# Patient Record
Sex: Female | Born: 1985 | Race: White | Hispanic: No | Marital: Single | Smoking: Former smoker
Health system: Southern US, Community
[De-identification: ages and names within clinical notes are randomized; demographics above are authoritative.]

## PROBLEM LIST (undated history)

## (undated) ENCOUNTER — Inpatient Hospital Stay (HOSPITAL_COMMUNITY): Payer: Self-pay

## (undated) DIAGNOSIS — Z8619 Personal history of other infectious and parasitic diseases: Secondary | ICD-10-CM

## (undated) DIAGNOSIS — IMO0002 Reserved for concepts with insufficient information to code with codable children: Secondary | ICD-10-CM

## (undated) DIAGNOSIS — F329 Major depressive disorder, single episode, unspecified: Secondary | ICD-10-CM

## (undated) DIAGNOSIS — Z8659 Personal history of other mental and behavioral disorders: Secondary | ICD-10-CM

## (undated) DIAGNOSIS — F419 Anxiety disorder, unspecified: Secondary | ICD-10-CM

## (undated) DIAGNOSIS — Z8759 Personal history of other complications of pregnancy, childbirth and the puerperium: Secondary | ICD-10-CM

## (undated) DIAGNOSIS — M419 Scoliosis, unspecified: Secondary | ICD-10-CM

## (undated) DIAGNOSIS — D649 Anemia, unspecified: Secondary | ICD-10-CM

## (undated) DIAGNOSIS — R51 Headache: Secondary | ICD-10-CM

## (undated) DIAGNOSIS — G8929 Other chronic pain: Secondary | ICD-10-CM

## (undated) DIAGNOSIS — O139 Gestational [pregnancy-induced] hypertension without significant proteinuria, unspecified trimester: Secondary | ICD-10-CM

## (undated) DIAGNOSIS — R87619 Unspecified abnormal cytological findings in specimens from cervix uteri: Secondary | ICD-10-CM

## (undated) DIAGNOSIS — M79671 Pain in right foot: Secondary | ICD-10-CM

## (undated) DIAGNOSIS — F32A Depression, unspecified: Secondary | ICD-10-CM

## (undated) HISTORY — DX: Personal history of other infectious and parasitic diseases: Z86.19

## (undated) HISTORY — PX: TONSILLECTOMY AND ADENOIDECTOMY: SHX28

## (undated) HISTORY — DX: Anemia, unspecified: D64.9

## (undated) HISTORY — DX: Pain in right foot: M79.671

## (undated) HISTORY — PX: ENDOMETRIAL ABLATION: SHX621

## (undated) HISTORY — DX: Unspecified abnormal cytological findings in specimens from cervix uteri: R87.619

## (undated) HISTORY — DX: Major depressive disorder, single episode, unspecified: F32.9

## (undated) HISTORY — DX: Headache: R51

## (undated) HISTORY — DX: Personal history of other mental and behavioral disorders: Z86.59

## (undated) HISTORY — DX: Other chronic pain: G89.29

## (undated) HISTORY — PX: TONSILLECTOMY: SUR1361

## (undated) HISTORY — DX: Depression, unspecified: F32.A

## (undated) HISTORY — DX: Personal history of other complications of pregnancy, childbirth and the puerperium: Z87.59

## (undated) HISTORY — PX: WISDOM TOOTH EXTRACTION: SHX21

## (undated) HISTORY — DX: Reserved for concepts with insufficient information to code with codable children: IMO0002

## (undated) HISTORY — PX: COLPOSCOPY: SHX161

---
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
## 2004-01-10 ENCOUNTER — Ambulatory Visit (HOSPITAL_COMMUNITY): Admission: RE | Admit: 2004-01-10 | Discharge: 2004-01-10 | Payer: Self-pay | Admitting: Obstetrics and Gynecology

## 2004-02-08 ENCOUNTER — Ambulatory Visit (HOSPITAL_COMMUNITY): Admission: RE | Admit: 2004-02-08 | Discharge: 2004-02-08 | Payer: Self-pay | Admitting: Obstetrics and Gynecology

## 2004-03-21 ENCOUNTER — Ambulatory Visit (HOSPITAL_COMMUNITY): Admission: RE | Admit: 2004-03-21 | Discharge: 2004-03-21 | Payer: Self-pay | Admitting: Obstetrics and Gynecology

## 2004-04-10 ENCOUNTER — Inpatient Hospital Stay (HOSPITAL_COMMUNITY): Admission: AD | Admit: 2004-04-10 | Discharge: 2004-04-10 | Payer: Self-pay | Admitting: Obstetrics and Gynecology

## 2004-04-11 ENCOUNTER — Inpatient Hospital Stay (HOSPITAL_COMMUNITY): Admission: AD | Admit: 2004-04-11 | Discharge: 2004-04-11 | Payer: Self-pay | Admitting: Obstetrics and Gynecology

## 2004-04-21 ENCOUNTER — Inpatient Hospital Stay (HOSPITAL_COMMUNITY): Admission: AD | Admit: 2004-04-21 | Discharge: 2004-04-21 | Payer: Self-pay | Admitting: Obstetrics and Gynecology

## 2004-04-28 ENCOUNTER — Inpatient Hospital Stay (HOSPITAL_COMMUNITY): Admission: AD | Admit: 2004-04-28 | Discharge: 2004-04-30 | Payer: Self-pay | Admitting: Obstetrics and Gynecology

## 2004-05-06 ENCOUNTER — Inpatient Hospital Stay (HOSPITAL_COMMUNITY): Admission: AD | Admit: 2004-05-06 | Discharge: 2004-05-09 | Payer: Self-pay | Admitting: Obstetrics and Gynecology

## 2004-05-06 ENCOUNTER — Encounter (INDEPENDENT_AMBULATORY_CARE_PROVIDER_SITE_OTHER): Payer: Self-pay | Admitting: *Deleted

## 2005-04-07 ENCOUNTER — Other Ambulatory Visit: Admission: RE | Admit: 2005-04-07 | Discharge: 2005-04-07 | Payer: Self-pay | Admitting: Obstetrics and Gynecology

## 2006-07-13 ENCOUNTER — Other Ambulatory Visit: Admission: RE | Admit: 2006-07-13 | Discharge: 2006-07-13 | Payer: Self-pay | Admitting: Obstetrics and Gynecology

## 2007-02-15 ENCOUNTER — Encounter (INDEPENDENT_AMBULATORY_CARE_PROVIDER_SITE_OTHER): Payer: Self-pay | Admitting: Obstetrics and Gynecology

## 2007-02-15 ENCOUNTER — Ambulatory Visit (HOSPITAL_COMMUNITY): Admission: RE | Admit: 2007-02-15 | Discharge: 2007-02-15 | Payer: Self-pay | Admitting: Obstetrics and Gynecology

## 2007-02-17 ENCOUNTER — Encounter: Admission: RE | Admit: 2007-02-17 | Discharge: 2007-02-17 | Payer: Self-pay | Admitting: Obstetrics and Gynecology

## 2007-07-26 ENCOUNTER — Encounter (HOSPITAL_BASED_OUTPATIENT_CLINIC_OR_DEPARTMENT_OTHER): Payer: Self-pay | Admitting: General Surgery

## 2007-07-26 ENCOUNTER — Ambulatory Visit (HOSPITAL_COMMUNITY): Admission: RE | Admit: 2007-07-26 | Discharge: 2007-07-26 | Payer: Self-pay | Admitting: General Surgery

## 2010-09-21 ENCOUNTER — Encounter: Payer: Self-pay | Admitting: Obstetrics and Gynecology

## 2010-11-09 ENCOUNTER — Emergency Department (HOSPITAL_COMMUNITY): Payer: No Typology Code available for payment source

## 2010-11-09 ENCOUNTER — Emergency Department (HOSPITAL_COMMUNITY)
Admission: EM | Admit: 2010-11-09 | Discharge: 2010-11-09 | Disposition: A | Discharge status: Home / Self Care | Payer: No Typology Code available for payment source | Attending: Emergency Medicine | Admitting: Emergency Medicine

## 2010-11-09 DIAGNOSIS — R51 Headache: Secondary | ICD-10-CM | POA: Insufficient documentation

## 2010-11-09 DIAGNOSIS — S139XXA Sprain of joints and ligaments of unspecified parts of neck, initial encounter: Secondary | ICD-10-CM | POA: Insufficient documentation

## 2010-11-09 DIAGNOSIS — R10813 Right lower quadrant abdominal tenderness: Secondary | ICD-10-CM | POA: Insufficient documentation

## 2010-11-09 DIAGNOSIS — M25519 Pain in unspecified shoulder: Secondary | ICD-10-CM | POA: Insufficient documentation

## 2010-11-09 DIAGNOSIS — Z79899 Other long term (current) drug therapy: Secondary | ICD-10-CM | POA: Insufficient documentation

## 2010-11-09 DIAGNOSIS — M542 Cervicalgia: Secondary | ICD-10-CM | POA: Insufficient documentation

## 2010-11-09 DIAGNOSIS — M546 Pain in thoracic spine: Secondary | ICD-10-CM | POA: Insufficient documentation

## 2010-11-09 DIAGNOSIS — Y929 Unspecified place or not applicable: Secondary | ICD-10-CM | POA: Insufficient documentation

## 2010-11-09 DIAGNOSIS — IMO0001 Reserved for inherently not codable concepts without codable children: Secondary | ICD-10-CM | POA: Insufficient documentation

## 2010-11-09 LAB — POCT PREGNANCY, URINE: Preg Test, Ur: NEGATIVE

## 2010-11-09 LAB — URINALYSIS, ROUTINE W REFLEX MICROSCOPIC
Glucose, UA: NEGATIVE mg/dL
Hgb urine dipstick: NEGATIVE
Leukocytes, UA: NEGATIVE
Protein, ur: 30 mg/dL — AB
Specific Gravity, Urine: 1.025 (ref 1.005–1.030)
Urobilinogen, UA: 1 mg/dL (ref 0.0–1.0)
pH: 6 (ref 5.0–8.0)

## 2010-11-09 LAB — URINE MICROSCOPIC-ADD ON

## 2010-11-17 ENCOUNTER — Inpatient Hospital Stay (INDEPENDENT_AMBULATORY_CARE_PROVIDER_SITE_OTHER)
Admission: RE | Admit: 2010-11-17 | Discharge: 2010-11-17 | Disposition: A | Discharge status: Home / Self Care | Payer: No Typology Code available for payment source | Source: Ambulatory Visit | Attending: Family Medicine | Admitting: Family Medicine

## 2010-11-17 DIAGNOSIS — S335XXA Sprain of ligaments of lumbar spine, initial encounter: Secondary | ICD-10-CM

## 2010-11-17 DIAGNOSIS — S0510XA Contusion of eyeball and orbital tissues, unspecified eye, initial encounter: Secondary | ICD-10-CM

## 2010-11-17 DIAGNOSIS — F411 Generalized anxiety disorder: Secondary | ICD-10-CM

## 2010-11-17 DIAGNOSIS — G542 Cervical root disorders, not elsewhere classified: Secondary | ICD-10-CM

## 2010-11-17 DIAGNOSIS — S139XXA Sprain of joints and ligaments of unspecified parts of neck, initial encounter: Secondary | ICD-10-CM

## 2010-12-22 ENCOUNTER — Ambulatory Visit
Payer: Medicaid Other | Attending: Physical Medicine and Rehabilitation | Admitting: Rehabilitative and Restorative Service Providers"

## 2010-12-22 DIAGNOSIS — M545 Low back pain, unspecified: Secondary | ICD-10-CM | POA: Insufficient documentation

## 2010-12-22 DIAGNOSIS — M2569 Stiffness of other specified joint, not elsewhere classified: Secondary | ICD-10-CM | POA: Insufficient documentation

## 2010-12-22 DIAGNOSIS — IMO0001 Reserved for inherently not codable concepts without codable children: Secondary | ICD-10-CM | POA: Insufficient documentation

## 2010-12-22 DIAGNOSIS — M542 Cervicalgia: Secondary | ICD-10-CM | POA: Insufficient documentation

## 2010-12-26 ENCOUNTER — Ambulatory Visit: Payer: Medicaid Other | Admitting: Rehabilitative and Restorative Service Providers"

## 2010-12-30 ENCOUNTER — Ambulatory Visit: Payer: Medicaid Other | Admitting: Physical Therapy

## 2011-01-01 ENCOUNTER — Ambulatory Visit: Payer: Medicaid Other | Admitting: Physical Therapy

## 2011-01-05 ENCOUNTER — Ambulatory Visit
Payer: Medicaid Other | Attending: Physical Medicine and Rehabilitation | Admitting: Rehabilitative and Restorative Service Providers"

## 2011-01-05 DIAGNOSIS — M545 Low back pain, unspecified: Secondary | ICD-10-CM | POA: Insufficient documentation

## 2011-01-05 DIAGNOSIS — M2569 Stiffness of other specified joint, not elsewhere classified: Secondary | ICD-10-CM | POA: Insufficient documentation

## 2011-01-05 DIAGNOSIS — IMO0001 Reserved for inherently not codable concepts without codable children: Secondary | ICD-10-CM | POA: Insufficient documentation

## 2011-01-05 DIAGNOSIS — M542 Cervicalgia: Secondary | ICD-10-CM | POA: Insufficient documentation

## 2011-01-12 ENCOUNTER — Ambulatory Visit: Payer: Medicaid Other | Admitting: Rehabilitative and Restorative Service Providers"

## 2011-01-13 NOTE — Op Note (Signed)
[name], [name] NO.:  [id number]   MEDICAL RECORD NO.:  [id number]          PATIENT TYPE:  AMB   LOCATION:  DAY                          FACILITY:  [facility]   PHYSICIAN:  Leonie Man, M.D.   DATE OF BIRTH:  [birth year]   DATE OF PROCEDURE:  07/26/2007  DATE OF DISCHARGE:                               OPERATIVE REPORT   PREOPERATIVE DIAGNOSIS:  Endometrial implant, right groin.   POSTOPERATIVE DIAGNOSIS:  Endometrial implant, right groin.   PROCEDURE:  Excision endometrioma, right groin.   SURGEON:  Leonie Man, M.D.   ASSISTANT:  OR tech.   ANESTHESIA:  General.   SPECIMENS:  Right groin mass forwarded to pathology.   ESTIMATED BLOOD LOSS:  Minimal.   There no complications during this procedure.  The patient returned to  the PACU in excellent condition.   [name] is a 25 year old patient status post cesarean section who for  the past several months has been having pain in the right groin during  her menses.  The mass increased in size during menses and the pain is  increased at that time.  She comes to the operating room now after risks  and potential benefits of surgery have been discussed.  All questions  answered, consent surgery obtained.   PROCEDURE:  The patient is positioned supinely.  Following induction of  satisfactory anesthesia, the right groin is prepped and draped to be  included in a sterile operative field.  The right lateral border of her  old fascial incision is opened up through skin and subcutaneous tissue  and dissection carried down.  Flap was raised superiorly towards the  region of the mass.  The mass was then dissected free and excised.  This  mass went down to the rectus fascia and was excised including a portion  of the rectus fascia.  This was forwarded for pathologic evaluation. The  rectus fascia was then closed with interrupted 3-0 Vicryl sutures.  Subcutaneous tissues closed with 3-0 Vicryl sutures after  sponge, instrument, and sharp counts verified.  The skin was closed with  running 4-0 Monocryl suture and reinforced with Steri-Strips.  Sterile  dressings applied.  The anesthetic reversed.  The patient removed from  the operating room to the recovery room in stable condition.  She  tolerated the procedure well.      Leonie Man, M.D.  Electronically Signed     PB/MEDQ  D:  07/26/2007  T:  07/26/2007  Job:  [id number]   cc:   Osborn Coho, M.D.  Fax: [phone]

## 2011-01-13 NOTE — Op Note (Signed)
[name], [name] NO.:  [id number]   MEDICAL RECORD NO.:  [id number]          PATIENT TYPE:  AMB   LOCATION:  SDC                           [facility]   PHYSICIAN:  Osborn Coho, M.D.   DATE OF BIRTH:  October 09, 1985   DATE OF PROCEDURE:  02/15/2007  DATE OF DISCHARGE:                               OPERATIVE REPORT   PREOPERATIVE DIAGNOSIS:  1. Menometrorrhagia.  2. Polyp.   POSTOPERATIVE DIAGNOSIS:  1. Menometrorrhagia.  2. Dysfunctional uterine bleeding.   PROCEDURE:  1. Hysteroscopy.  2. Dilation and curettage.   ATTENDING:  Osborn Coho, M.D.   ANESTHESIA:  General via LMA.   FLUIDS:  1000 mL   URINE OUTPUT:  Quantity sufficient via straight cath prior to procedure.   ESTIMATED BLOOD LOSS:  Minimal.   HYSTEROSCOPIC FLUID DEFICIT OF SORBITOL:  20 mL   SPECIMENS TO PATHOLOGY:  Endometrial curettings.   COMPLICATIONS:  None.   PROCEDURE:  The patient was taken to the operating room after risks,  benefits, and alternatives reviewed with the patient.  The patient  verbalized understanding and consent signed and witnessed.  The patient  was placed under general anesthesia and prepped and draped in the normal  sterile fashion in the dorsal lithotomy position.  A bivalve speculum  placed in the patient's vagina and the anterior lip of the cervix  grasped with single-tooth tenaculum.  Paracervical block was  administered using a total of 10 mL of 1% lidocaine.  The cervix was  dilated for passage of the diagnostic hysteroscope.  Hysteroscope was  introduced after sounding the uterus to 10 cm and no intracavitary  lesions were noted.  Possibly a small polyp in the lower uterine  segment.  Curettage was performed until a gritty  texture was noted and curettings sent to pathology.  All instruments  were removed.  There was good hemostasis at the tenaculum site.  Count  was correct.  The patient tolerated procedure well and is currently  awaiting transfer to the recovery room in good condition.      Osborn Coho, M.D.  Electronically Signed     AR/MEDQ  D:  02/15/2007  T:  02/15/2007  Job:  [id number]

## 2011-01-13 NOTE — H&P (Signed)
NAME:  [name], [name] NO.:  [id number]   MEDICAL RECORD NO.:  [id number]          PATIENT TYPE:  AMB   LOCATION:  SDC                           [facility]   PHYSICIAN:  Osborn Coho, M.D.   DATE OF BIRTH:  1986-06-22   DATE OF ADMISSION:  DATE OF DISCHARGE:                              HISTORY & PHYSICAL   HISTORY OF PRESENT ILLNESS:  [name] is a 25 year old single white  female para 3, 0-0-3 who presents for hysteroscopy with possible  resection of an endometrial polyp due to menorrhagia.  For the past 6  months the patient has experienced almost daily vaginal bleeding.  She  may change her pad plus a tampon 1-4 times per day.  Additionally she  reports cramping which she rates as an 8/10 on a 10 point pain scale,  but does not take anything for this. Since the onset of her symptoms she  has been on Yaz oral contraceptives, but has not had any change in her  bleeding pattern.  She denies any nausea, vomiting, diarrhea, fever,  urinary tract symptoms or vaginitis symptoms.  She does, however, admit  to vulvar irritation due to friction from her perineal pads.  A sono  histogram in January of 2008 revealed a polyp in the lower uterine  segment of the endometrium measuring 0.7 cm x 0.2 cm.  In light of these  findings and her symptoms the patient wishes to proceed with polyp  resection via hysteroscopy.   OB HISTORY:  Gravida 2, para 3, 0-0-3.  The patient delivered a  singleton via spontaneous vaginal birth and twins by cesarean section.   GYN HISTORY:  Menarche 25 years old.  Last menstrual period ?  02/07/2007.  The patient uses Yaz as her method of contraception.  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  The  patient's last normal Pap smear was February 09, 2007 and is pending.   PAST MEDICAL HISTORY:  Migraines and anemia.   PAST SURGICAL HISTORY:  In 1993 tonsillectomy and adenoidectomy.   FAMILY HISTORY:   Cardiovascular disease, seizure, stroke, migraines,  depression, bipolar disorder, COPD, throat cancer, diabetes (type 2).   SOCIAL HISTORY:  The patient is single and she is unemployed.   HABITS:  She does not use alcohol.  She smokes one pack of cigarettes  per day.   CURRENT MEDICATIONS:  1. Femcon FE one tablet 3x a day (to be taken for 7 days starting      02/09/2007).  2. Mycolog II cream twice daily to vulvar area.   ALLERGIES:  PENICILLIN.  The patient is unsure of the reaction.   REVIEW OF SYSTEMS:  The patient admits to lightheadedness, decreased  appetite, but denies any chest pain, shortness of breath, headache,  vision changes, myalgias and except as is mentioned in history of  present illness the patient's review of systems is negative.   PHYSICAL EXAMINATION:  Blood pressure 112/78, weight is 174, height is 5  feet 6 inches tall.  NECK:  Supple.  There are no masses, thyromegaly or cervical adenopathy.  HEART:  Regular rate and rhythm.  LUNGS:  Clear.  There are no wheezes, rales or rhonchi.  BACK:  No CVA tenderness.  ABDOMEN:  No tenderness, masses or organomegaly.  EXTREMITIES:  No clubbing, cyanosis or edema.  PELVIC:  EG BUS.  The patient does have a linear, erythematous rash  along the border of her labia majora.  There is no inguinal adenopathy.  Vagina is normal with a small amount of brown discharge.  Cervix is  nontender without lesions.  Uterus appears normal size, shape and  consistency without tenderness.  Adnexa: No tenderness or masses.   Urine pregnancy test is negative.  Hemoglobin 14.8.   IMPRESSION:  1. Menorrhagia.  2. Endometrial polyp.   DISPOSITION:  A discussion was held with the patient regarding the  indication for her procedures along with their risks which include, but  are not limited to reaction to anesthesia, damage to adjacent organs,  excessive bleeding and infection.  The patient has consented to proceed  with hysteroscopy  with a possibility of polyp resection at [facility] on February 15, 2007 at 02:00 p.m.      Elmira J. Adline Peals.      Osborn Coho, M.D.  Electronically Signed    EJP/MEDQ  D:  02/10/2007  T:  02/10/2007  Job:  [id number]

## 2011-01-15 ENCOUNTER — Ambulatory Visit: Payer: Medicaid Other | Admitting: Rehabilitative and Restorative Service Providers"

## 2011-01-16 NOTE — H&P (Signed)
NAME:  [name], EDISON                          ACCOUNT NO.:  [id number]   MEDICAL RECORD NO.:  [id number]                   PATIENT TYPE:  INP   LOCATION:  9170                                 FACILITY:  [facility]   PHYSICIAN:  Janine Limbo, M.D.            DATE OF BIRTH:  26-Nov-1985   DATE OF ADMISSION:  04/28/2004  DATE OF DISCHARGE:                                HISTORY & PHYSICAL   HISTORY OF PRESENT ILLNESS:  Ms. Zimny is an 25 year old, gravida 2, para 1-  0-0-1, at 18 and 6/7ths weeks with a twin gestation, who presented from the  office for PIH evaluation.  She was seen today for a routine visit.  She did  complain of mild headache, black visual spots, and increased swelling.  She  reports she gained approximately 7 pounds since last visit approximately 1  week ago.  She does report some cramping sporadically.  The cervix has been  2 cm, but had no change today per Dr. Normand Sloop.  She is on terbutaline q.4h.  She received betamethasone on April 10, 2004 and April 11, 2004.  She had  a trace protein on urine examination today.  While in maternity admission,  she had a PIH work up which was negative.  Blood pressures were within  normal limits, but she was noted to be contracting significantly.  She took  her scheduled dose of p.o. terbutaline, which was of no benefit.  Then she  received a dose of subcutaneous terbutaline, which, again, was of no  benefit.  She is now therefore to be admitted for magnesium sulfate therapy.  Pregnancy has been remarkable for (1) twin gestation, with twin A in a  breech presentation, twin B transverse, (2) premature labor this pregnancy,  currently on bed rest, (3) positive group B strep.   PRENATAL LABORATORIES:  Blood type is A positive, Rh antibody negative, VDRL  nonreactive, rubella titer positive, hepatitis B surface antigen negative,  HIV nonreactive.  GC and Chlamydia cultures were negative at her first  visit.  Pap smear was normal.   Glucose challenge was normal.  HIV was  nonreactive.  Group B strep culture was positive approximately 2 weeks ago.  Her EDC of June 16, 2004 was established by a 10-week ultrasound.   HISTORY OF PRESENT PREGNANCY:  The patient entered care at approximately 11  weeks.  She was diagnosis with diamniotic dichorionic twins.  Ultrasound at  17 weeks showed size equal to dates, 2 female infants.  The cervix was  normal.  Quad screen was normal.  Another ultrasound was done at 21 weeks,  showing appropriate growth and normal cervix.  She had some spotting at 25  weeks, which was evaluated and found to have no issue.  She had her Glucola,  which was normal.  She had an ultrasound at 27 weeks showing twin A  transverse, twin B transverse, with normal cervical length.  She was having  some pressure at 29 weeks, but cervix was normal.  She began to have some  other preterm labor symptoms at 30 weeks.  Her cervix at that time was 1-2,  70%, vertex, and -1 station.  Fetal fibronectin was sent, and was negative.  She was admitted for 23-hour observation.  She received betamethasone.  There was no change in her cervix at 31 weeks.  She was diagnosed with  positive group B5 strep.  She was seen at the hospital last week for some  contractions, and was admitted overnight again.  She was seen in the office  today with her cervix 2 cm, and blood pressure 140/88, and 140/90.  Urine  was trace for protein.   OBSTETRICAL HISTORY:  In August of 2003, she had a vaginal birth of a female  infant, weight 7 pounds, 8 ounces, at 36 and 3/7ths weeks.  She was in labor  19 hours.  She had epidural anesthesia with no complications.   MEDICAL HISTORY:  She is an oral contraceptive user.  She has a history of  oral HSV-1.  She has an occasional yeast infection.  She reports the usual  childhood illnesses.   PAST SURGICAL HISTORY:  Her only surgery was tonsillectomy and adenoidectomy  in 1993.   ALLERGIES:  She is  possibly sensitive to PENICILLIN.  She was told this as a  child, but has had no other exposures to it.   FAMILY HISTORY:  Her maternal grandmother had heart disease.  Father of the  baby has hypertension.  Her mother and maternal grandmother had emphysema.  Maternal grandmother had a stroke.  Maternal cousin had a seizure.  Paternal  grandfather had throat cancer.  Mother, maternal aunt, and maternal  grandmother had migraines.  Her father is bipolar.  Her mother had a history  of depression.  The patient's mother died last year as a result of a car  accident.  There are multiple smokers in the family.   GENETIC HISTORY:  Unremarkable, except for twins on the father of the baby's  side.   SOCIAL HISTORY:  The patient is single.  The father of the baby is  supportive, although they are currently separated.  The patient has an 11th  grade education.  She is currently unemployed.  Her partner has a high  school education.  He is employed as a Naval architect.  She has been followed  by the physician service of [facility].  She denies any alcohol,  drug, or tobacco use during this pregnancy.   PHYSICAL EXAMINATION:  VITAL SIGNS:  Blood pressures are 140's-150's/70's-  80s, pulse 101, temperature 98.6, respirations 20.  Her weight today was 256  pounds on office scales.  HEENT:  Within normal limits.  LUNGS:  Breath sounds were clear.  HEART:  Regular rate and rhythm without murmur.  BREASTS:  Soft and nontender.  ABDOMEN:  Fundal height is approximately 40 cm.  Fetus A is breech.  Fetus B  is transverse.  The fetuses are somewhat difficult to trace.  Twin A is  reactive, twin B is reassuring.  There are no decelerations.  Uterine  contractions initially were every 5 minutes, but then diminished to every 2-  5 minutes, irregular in quality.  Some are stronger than others. EXTREMITIES:  Deep tendon reflexes are 2+ without clonus.  There is 1+ edema  in the lower extremities.    LABORATORY DATA:  Urine was trace for protein.  CBC showed hemoglobin of  11.9, hematocrit of 34.3.  White blood cell count of 12.8, and platelets of  190.  Comprehensive metabolic panel was normal.  Uric acid was 5.1.  LDH was  159.   IMPRESSION:  1. Intrauterine twin pregnancy at 61 and 6/7ths weeks.  2. Preterm labor.  3. Mild elevation of blood pressure.  4. Positive group B strep.   PLAN:  1. Admit to birthing suite antenatal unit per consult with Dr. Marline Backbone as attending physician.  2. Routine physician orders.  3. Plan magnesium sulfate therapy with 4 gm bolus and 2 gm per hour.  4. Bed rest with bathroom privileges.  5. Will repeat magnesium level in the a.m.  6. M.D.'s will follow.     Renaldo Reel Emilee Hero, C.N.M.                   Janine Limbo, M.D.    Leeanne Mannan  D:  04/28/2004  T:  04/28/2004  Job:  [id number]

## 2011-01-16 NOTE — Discharge Summary (Signed)
NAME:  [name], [name]                          ACCOUNT NO.:  [id number]   MEDICAL RECORD NO.:  [id number]                   PATIENT TYPE:   LOCATION:  9117                                 FACILITY:   PHYSICIAN:  Naima A. Dillard, M.D.              DATE OF BIRTH:  Nov 21, 1985   DATE OF ADMISSION:  05/06/2004  DATE OF DISCHARGE:  05/09/2004                                 DISCHARGE SUMMARY   ADMISSION DIAGNOSES:  1.  Intrauterine pregnancy at 34 weeks' gestation.  2.  Preeclampsia.  3.  Twin gestation.  4.  Malposition of second twin and the second twin is the larger twin.   DISCHARGE DIAGNOSES:  1.  Intrauterine pregnancy at 21 weeks' gestation.  2.  Preeclampsia.  3.  Twin gestation.  4.  Malposition of second twin and the second twin is the larger twin.  5.  Status post primary low transverse cesarean section for delivery of Twin      A in the Regular Nursery and Twin B in the NICU.  6.  Breast feeding.  7.  Undecided regarding contraception.   PROCEDURES THIS ADMISSION:  A primary low transverse cesarean section for  delivery of Twin A who was a viable female infant named Kaitlynn who weighed  5 pounds 12 ounces and was vertex in presentation, had Apgar's of 8 and 9  and Twin B was a viable female infant named Charlotte Sanes who weighed 6 pounds 9  ounces and had Apgar's of 6 and 7 and went to the Intensive Care Nursery due  to apnea episodes.  The cesarean section was performed by Dr. Dierdre Forth and Saverio Danker, C.N.M.   HOSPITAL COURSE:  Ms. Minich is a 25 year old, single, white female gravida  2, para 1-0-0-1 at 34-1/7 weeks who was admitted with preeclampsia and was  recommended to proceed with delivery secondary to the preeclampsia.  Her  Baby A was vertex in presentation and Baby B was breech presentation and  recommendation was to proceed with cesarean section for delivery.  She  underwent the same for delivery of two viable female infants.  Baby A is  Kaitlynn and weighed 5 pounds 12 ounces and had Apgar's of 8 and 9.  Baby B  was Savannah weighed 6 pounds 9 ounces and had Apgar's of 6 and 7.  Please  see operative note for further details.  Postpartum course has gone well.  She is ambulating, voiding, and eating without difficulty.  Her vital signs  were stable and she has been afebrile.  She was on magnesium sulfate for 24  hours following delivery but has subsequently done very well.  She is breast  feeding without difficulty and is currently undecided regarding  contraception.  She is deemed ready for discharge today.   DISCHARGE INSTRUCTIONS:  As per the [facility] OB/GYN handout.   DISCHARGE MEDICATIONS:  Motrin 600 mg p.o. q.6h. p.r.n. for pain and  Tylox 1-  2 p.o. q.4-6h. p.r.n. for pain.  Prenatal vitamins daily.   DISCHARGE FOLLOW UP:  Will be on Monday for staple removal at [facility] OB/GYN office or p.r.n.   DISCHARGE LABORATORY DATA:  Hemoglobin is 9.1, WBC count 11.2, platelets are  151.   DISCHARGE STATUS:  Unstable.     Concha Pyo. Duplantis, C.N.M.              Naima A. Normand Sloop, M.D.    SJD/MEDQ  D:  05/09/2004  T:  05/09/2004  Job:  [id number]

## 2011-01-16 NOTE — H&P (Signed)
NAME:  [name], [name] NO.:  [id number]   MEDICAL RECORD NO.:  [id number]                   PATIENT TYPE:  MAT   LOCATION:  MATC                                 [facility]ACILITY:  [facility]   PHYSICIAN:  Hal Morales, M.D.             DATE OF BIRTH:  Jan 22, 1986   DATE OF ADMISSION:  05/06/2004  DATE OF DISCHARGE:                                HISTORY & PHYSICAL   Ms. Curb is a 25 year old, single white female, gravida 2, para 1-0-0-1,  at 34-1/7 weeks with a twin gestation, seen today in maternity admissions  secondary to having elevated blood pressure noted at the office.  She was  sent to maternity admissions initially for serial blood pressure, fetal  monitoring, and catheterized UA.  She has reported decreased fetal movement  on baby A over last day or so but denies any headaches, nausea, vomiting, or  visual disturbances.  She also denies any leaking or bleeding but has  reported uterine contraction every 5 to 6 minutes despite p.o. terbutaline.  Her pregnancy has been followed at [facility] by the MD service  and is complicated by 1) twin gestation, 2) history of abnormal Pap, 3)  adolescence with a supportive partner, 4) recent admission for preterm  cervical change and preterm uterine contractions requiring magnesium sulfate  for tocolysis last week.  She did receive betamethasone series approximately  a month ago.  In complication #5, baby A is breach, baby B is transverse by  ultrasound about two weeks ago.  And #6 is positive group B strep.  The  patient and her partner desire to proceed with deliver if that is an option.   OB-GYN HISTORY:  She is gravida 2, para 1-0-0-1 who delivered a viable female  infant in August 2003.  The baby weighed 7 pounds 8 ounces at 36-1/[redacted] weeks  gestation without complication.  That infant's name was [name] and was  attended in delivery by Wynelle Bourgeois, C.N.M.   PAST MEDICAL HISTORY:  She reports having  had the usual childhood disease.  She has no other medical issues.  The only surgery was tonsils and adenoids  in 1993.   ALLERGIES:  PENICILLIN.  She was informed of such as a child.   FAMILY HISTORY:  Significant for maternal grandmother with heart disease,  father of baby with hypertension, mother and maternal grandmother with COPD,  maternal grandmother with CVA, and cousin with seizures, paternal  grandfather with throat cancer, multiple family members with migraines,  father with bipolar disorder, and mother with depression.   GENETIC HISTORY:  Essentially negative.   SOCIAL HISTORY:  She is single.  The father of the baby is Merrilee Seashore.  He  is involved and supportive. She is currently unemployed, and he is a Ecologist.  They are of the [employer] faith.  They deny any illicit drugs or  alcohol with this pregnancy,  and she does not smoke.  She has multiple  family members that smoke.   PRENATAL LABORATORY DATA:  Her blood type is A positive.  Antibody screen is  negative.  Syphilis is nonreactive.  Rubella is positive.  Hepatitis B  surface antigen is negative.  HIV is nonreactive.  Pap was normal in April  2004. GC and Chlamydia are negative.  Cystic fibrosis is negative. Group B  strep was positive at 36 weeks.   PHYSICAL EXAMINATION:  VITAL SIGNS:  Blood pressure in 140s/80s with the  highest blood pressure 150/48.  She is afebrile.  HEENT:  Grossly within normal limits.  HEART:  Regular rate and rhythm.  CHEST:  Clear.  BREASTS:  Soft and nontender.  ABDOMEN:  Gravid with uterine contractions noted every 3 to 4 minutes.  Fetal heart rate on baby A is 150s to 160s and baby B 150s to 160s with  accelerations.  PELVIC:  Cervix was 2 cm at the office.  EXTREMITIES:  2+ reflexes, no clonus, 1 to 2+ edema.   LABORATORY DATA:  Catheterization urine today has 1+ protein.   IMPRESSION:  1.  Intrauterine pregnancy at 34-1/7 weeks twin gestation.  2.  Preeclampsia most  probably.  3.  Breach presentation and transverse presentation.  4.  Positive group B Streptococcus.   PLAN:  Per consult with Dr. Pennie Rushing who will come to evaluate patient and  discuss delivery. Will initiate PIH labs and IV at this time.     Concha Pyo. Duplantis, C.N.M.              Hal Morales, M.D.    SJD/MEDQ  D:  05/06/2004  T:  05/06/2004  Job:  [id number]

## 2011-01-16 NOTE — Op Note (Signed)
NAME:  [name], [name]                          ACCOUNT NO.:  [id number]   MEDICAL RECORD NO.:  [id number]                   PATIENT TYPE:  INP   LOCATION:  9160                                 FACILITY:  [facility]   PHYSICIAN:  Hal Morales, M.D.             DATE OF BIRTH:  1985/12/16   DATE OF PROCEDURE:  05/06/2004  DATE OF DISCHARGE:                                 OPERATIVE REPORT   PREOPERATIVE DIAGNOSIS:  Twin gestation at 34 weeks, pre-eclampsia,  malposition of second twin.   POSTOPERATIVE DIAGNOSIS:  Twin gestation at 34 weeks, pre-eclampsia,  malposition of second twin.  Polyhydramnios and large for gestational age  infant.   OPERATION PERFORMED:  Primary low transverse cesarean section.   SURGEON:  Hal Morales, M.D.   ASSISTANT:  Concha Pyo. Duplantis, C.N.M.   ANESTHESIA:  Spinal.   ESTIMATED BLOOD LOSS:  1000 mL.   COMPLICATIONS:  None.   FINDINGS:  The uterus, tubes and ovaries were normal for the gravid state.  There was copious amniotic fluid probably consistent with polyhydramnios.  Baby A was a girl, Kaitlynn weighing 5 pounds 12 ounces with Apgars of 8 and  9 at one and five minutes, respectively. Baby B was a girl, Savannah  weighing 6 pounds 9 ounces with Apgars of 6 and 7 at one and five minutes,  respectively and a cord pH of 7.28.   DESCRIPTION OF PROCEDURE:  The patient was taken to the operating room after  appropriate identification and placed on the operating table.  After  placement of a spinal anesthetic, she was placed in the supine position with  a left lateral tilt.  The abdomen and perineum were prepped with multiple  layers of Betadine and a Foley catheter inserted into the bladder and  connected to straight drainage.  The abdomen was draped as a sterile field.  The suprapubic region was tested and found to have surgical anesthesia.  The  suprapubic region was then infiltrated with 20 mL of 0.25% Marcaine.  A  suprapubic incision was  made and the abdomen opened in layers.  The  peritoneum was entered and the bladder blade placed.  The uterus was incised  approximately 2 cm above the uterovesical fold and baby A was delivered from  the occiput transverse position.  The nares and pharynx were suctioned and  the cord clamped and cut.  A loose nuchal cord had been reduced prior to  delivery of the body.  Baby A was handed off to the waiting pediatricians.  The membranes of baby B were leaked down to identify the presenting part  which was a hand.  The hand was held up through the abdomen and the  membranes ruptured.  The infant, who at that time was transverse, was  converted to a frank breech and delivered as a frank breech.  The nares and  pharynx were suctioned and the cord  clamped and cut.  The infant was handed  off to the waiting pediatricians.  The appropriate cord blood was drawn from  each umbilical cord and the placenta manually removed from the uterus.  Vigorous uterine massage was carried out with the achievement of adequate  hemostasis.  The uterine incision was then closed with a running  interlocking suture of 0 Vicryl.  An imbricating suture of 0 Vicryl was then  placed.  Hemostasis was noted to be adequate and copious irrigation was  carried out.  The abdominal peritoneum was closed with a running suture of 2-  0 Vicryl.  The rectus muscles were reapproximated in the midline with a  figure-of-eight suture of 2-0 Vicryl.  The rectus fascia was closed with a  running suture of 0 Vicryl and then reinforced on either side of midline  with figure-of-eight sutures of 0 Vicryl.  The subcutaneous tissue was made  hemostatic with Bovie cautery and irrigated.  A 7 mm Jackson-Pratt flat  drain was placed in the subcutaneous space through a stab wound in the left  lower quadrant.  The skin incision was closed with skin staples.  A sterile  dressing was applied after the drain had been sewn in with a suture of 2-0   silk.  The grenade was loaded on the Jackson-Pratt drain and the patient  taken from the operating room to the recovery room in satisfactory condition  having tolerated the procedure well.  Sponge and instrument counts were  correct.  Baby A went to the full term nursery, baby B went to the neonatal  intensive care unit.                                               Hal Morales, M.D.    VPH/MEDQ  D:  05/06/2004  T:  05/07/2004  Job:  [id number]

## 2011-01-16 NOTE — Discharge Summary (Signed)
[name], [name]                          ACCOUNT NO.:  [id number]   MEDICAL RECORD NO.:  [id number]                   PATIENT TYPE:  INP   LOCATION:  9170                                 FACILITY:  [facility]   PHYSICIAN:  Janine Limbo, M.D.            DATE OF BIRTH:  Jan 14, 1986   DATE OF ADMISSION:  04/28/2004  DATE OF DISCHARGE:  04/30/2004                                 DISCHARGE SUMMARY   ADMISSION DIAGNOSES:  1.  Intrauterine pregnancy at 30 and six-sevenths weeks with twins.  2.  Preterm labor.  3.  Mild hypertension.   DISCHARGE DIAGNOSES:  1.  Intrauterine pregnancy at 12 and six-sevenths weeks with twins.  2.  Preterm labor.  3.  Mild hypertension.   HOSPITAL PROCEDURES:  1.  Electronic fetal monitoring.  2.  Magnesium sulfate tocolysis.   HOSPITAL COURSE:  The patient was admitted for hypertension evaluation,  complaining of a mild headache and visual spots with increased swelling.  She also reported cramping sporadically.  Cervix has remained at 2 cm with  no change but she was admitted for tocolysis.  She received a betamethasone  series on April 10, 2004.  Blood pressure on admission was 154/77.  The  twins were reactive and uterine contractions were every 5 minutes.  She was  given a dose of terbutaline.  She did not have adequate resolution and was  therefore placed on magnesium sulfate tocolysis.  On hospital day #1 she was  doing well, blood pressure 140/77.  Fetal heart rates were reassuring.  Uterine contractions were every 10-15 minutes.  Magnesium level was 3.7.  Magnesium was increased at 2.5 g/hour and then was able to be weaned.  On  April 30, 2004 she continued to do well with no change in her cervix, which  was 1-2 cm, 50%, and high.  Fetal heart rate tracings were reactive.  Magnesium sulfate was discontinued per the patient's request.  The patient  was given terbutaline for use at home and was discharged home.   DISCHARGE MEDICATIONS:  1.   Prenatal vitamins.  2.  Terbutaline 2.5 mg p.o. q.4h. p.r.n.   DISCHARGE LABORATORY DATA:  White blood cell count 12.8, hemoglobin 11.9,  platelets 190.  PIH labs within normal limits.   DISCHARGE INSTRUCTIONS:  The patient is to continue bedrest and use  terbutaline as needed.   DISCHARGE FOLLOW-UP:  In 1 week at [facility] or p.r.n.    Marie L. Williams, C.N.M.                 Janine Limbo, M.D.   MLW/MEDQ  D:  04/30/2004  T:  05/01/2004  Job:  [id number]

## 2011-01-20 ENCOUNTER — Ambulatory Visit: Payer: Medicaid Other | Admitting: Physical Therapy

## 2011-01-22 ENCOUNTER — Ambulatory Visit: Payer: Medicaid Other | Admitting: Rehabilitative and Restorative Service Providers"

## 2011-01-27 ENCOUNTER — Ambulatory Visit: Payer: Medicaid Other | Admitting: Rehabilitative and Restorative Service Providers"

## 2011-01-29 ENCOUNTER — Ambulatory Visit: Payer: Medicaid Other | Admitting: Rehabilitative and Restorative Service Providers"

## 2011-02-02 ENCOUNTER — Ambulatory Visit: Payer: Medicaid Other | Attending: Physical Medicine and Rehabilitation | Admitting: Physical Therapy

## 2011-02-02 DIAGNOSIS — M545 Low back pain, unspecified: Secondary | ICD-10-CM | POA: Insufficient documentation

## 2011-02-02 DIAGNOSIS — M2569 Stiffness of other specified joint, not elsewhere classified: Secondary | ICD-10-CM | POA: Insufficient documentation

## 2011-02-02 DIAGNOSIS — M542 Cervicalgia: Secondary | ICD-10-CM | POA: Insufficient documentation

## 2011-02-02 DIAGNOSIS — IMO0001 Reserved for inherently not codable concepts without codable children: Secondary | ICD-10-CM | POA: Insufficient documentation

## 2011-02-04 ENCOUNTER — Ambulatory Visit: Payer: Medicaid Other

## 2011-02-10 ENCOUNTER — Ambulatory Visit: Payer: Medicaid Other | Admitting: Rehabilitative and Restorative Service Providers"

## 2011-02-11 ENCOUNTER — Ambulatory Visit: Payer: Medicaid Other | Admitting: Physical Therapy

## 2011-02-20 ENCOUNTER — Ambulatory Visit: Payer: Medicaid Other

## 2011-02-23 ENCOUNTER — Ambulatory Visit: Payer: Medicaid Other

## 2011-03-02 LAB — ABO/RH

## 2011-03-02 LAB — RPR: RPR: NONREACTIVE

## 2011-03-02 LAB — RUBELLA ANTIBODY, IGM: Rubella: IMMUNE

## 2011-03-02 LAB — HIV ANTIBODY (ROUTINE TESTING W REFLEX): HIV: NONREACTIVE

## 2011-04-10 ENCOUNTER — Inpatient Hospital Stay (INDEPENDENT_AMBULATORY_CARE_PROVIDER_SITE_OTHER)
Admission: RE | Admit: 2011-04-10 | Discharge: 2011-04-10 | Disposition: A | Discharge status: Home / Self Care | Payer: Medicaid Other | Source: Ambulatory Visit | Attending: Family Medicine | Admitting: Family Medicine

## 2011-04-10 DIAGNOSIS — Z331 Pregnant state, incidental: Secondary | ICD-10-CM

## 2011-04-10 DIAGNOSIS — R059 Cough, unspecified: Secondary | ICD-10-CM

## 2011-04-10 DIAGNOSIS — R05 Cough: Secondary | ICD-10-CM

## 2011-04-23 ENCOUNTER — Inpatient Hospital Stay (INDEPENDENT_AMBULATORY_CARE_PROVIDER_SITE_OTHER)
Admission: RE | Admit: 2011-04-23 | Discharge: 2011-04-23 | Disposition: A | Discharge status: Home / Self Care | Payer: Medicaid Other | Source: Ambulatory Visit | Attending: Emergency Medicine | Admitting: Emergency Medicine

## 2011-04-23 DIAGNOSIS — R05 Cough: Secondary | ICD-10-CM

## 2011-04-24 ENCOUNTER — Inpatient Hospital Stay (HOSPITAL_COMMUNITY): Payer: Medicaid Other

## 2011-04-24 ENCOUNTER — Inpatient Hospital Stay (HOSPITAL_COMMUNITY)
Admission: AD | Admit: 2011-04-24 | Discharge: 2011-04-24 | Disposition: A | Discharge status: Home / Self Care | Payer: Medicaid Other | Source: Ambulatory Visit | Attending: Obstetrics and Gynecology | Admitting: Obstetrics and Gynecology

## 2011-04-24 ENCOUNTER — Encounter (HOSPITAL_COMMUNITY): Payer: Self-pay

## 2011-04-24 DIAGNOSIS — J4489 Other specified chronic obstructive pulmonary disease: Secondary | ICD-10-CM | POA: Insufficient documentation

## 2011-04-24 DIAGNOSIS — J3489 Other specified disorders of nose and nasal sinuses: Secondary | ICD-10-CM | POA: Insufficient documentation

## 2011-04-24 DIAGNOSIS — O99891 Other specified diseases and conditions complicating pregnancy: Secondary | ICD-10-CM | POA: Insufficient documentation

## 2011-04-24 DIAGNOSIS — J449 Chronic obstructive pulmonary disease, unspecified: Secondary | ICD-10-CM | POA: Insufficient documentation

## 2011-04-24 HISTORY — DX: Anxiety disorder, unspecified: F41.9

## 2011-04-24 LAB — DIFFERENTIAL
Eosinophils Relative: 2 % (ref 0–5)
Lymphocytes Relative: 14 % (ref 12–46)
Lymphs Abs: 1.7 10*3/uL (ref 0.7–4.0)
Monocytes Absolute: 0.9 10*3/uL (ref 0.1–1.0)
Monocytes Relative: 7 % (ref 3–12)
Neutro Abs: 9.5 10*3/uL — ABNORMAL HIGH (ref 1.7–7.7)
Neutrophils Relative %: 77 % (ref 43–77)

## 2011-04-24 LAB — CBC
MCH: 31.6 pg (ref 26.0–34.0)
MCV: 91.7 fL (ref 78.0–100.0)
WBC: 12.4 10*3/uL — ABNORMAL HIGH (ref 4.0–10.5)

## 2011-04-24 MED ORDER — IPRATROPIUM BROMIDE 0.02 % IN SOLN
0.5000 mg | Freq: Once | RESPIRATORY_TRACT | Status: AC
  Administered 2011-04-24: 0.5 mg via RESPIRATORY_TRACT

## 2011-04-24 MED ORDER — ALBUTEROL SULFATE (5 MG/ML) 0.5% IN NEBU
INHALATION_SOLUTION | RESPIRATORY_TRACT | Status: AC
  Filled 2011-04-24: qty 0.5

## 2011-04-24 MED ORDER — ALBUTEROL SULFATE (5 MG/ML) 0.5% IN NEBU
2.5000 mg | INHALATION_SOLUTION | Freq: Once | RESPIRATORY_TRACT | Status: AC
  Administered 2011-04-24: 2.5 mg via RESPIRATORY_TRACT

## 2011-04-24 MED ORDER — IPRATROPIUM BROMIDE 0.02 % IN SOLN
RESPIRATORY_TRACT | Status: AC
  Filled 2011-04-24: qty 2.5

## 2011-04-24 MED ORDER — ALBUTEROL SULFATE (5 MG/ML) 0.5% IN NEBU
2.5000 mg | INHALATION_SOLUTION | Freq: Four times a day (QID) | RESPIRATORY_TRACT | Status: DC | PRN
  Administered 2011-04-24: 2.5 mg via RESPIRATORY_TRACT
  Filled 2011-04-24: qty 0.5

## 2011-04-24 NOTE — Progress Notes (Signed)
Pt states dx'd w/pneumonia, given zpak and codeine cough syrup, did help some, however has worsened. Pt not sleeping at night due to coughing, congestion, green colored mucus noted. Denies bleeding or vag d/c changes.

## 2011-04-24 NOTE — Consult Note (Addendum)
Subjective: Patient reports that she has felt bad for a month and that the breathing treatment did not help much.  Objective: I have reviewed patient's vital signs and labs.  BP 124/57  Pulse 82  Temp(Src) 98.7 F (37.1 C) (Oral)  Resp 18  Ht 5\' 6"  (1.676 m)  Wt 100.699 kg (222 lb)  BMI 35.83 kg/m2  SpO2 93%  CBC    Component Value Date/Time   WBC 12.4* 04/24/2011 1609   RBC 3.86* 04/24/2011 1609   HGB 12.2 04/24/2011 1609   HCT 35.4* 04/24/2011 1609   PLT 161 04/24/2011 1609   MCV 91.7 04/24/2011 1609   MCH 31.6 04/24/2011 1609   MCHC 34.5 04/24/2011 1609   RDW 13.0 04/24/2011 1609   LYMPHSABS 1.7 04/24/2011 1609   MONOABS 0.9 04/24/2011 1609   EOSABS 0.3 04/24/2011 1609   BASOSABS 0.0 04/24/2011 1609     General: alert and no distress Resp: rhonchi bilaterally Cardio: regular rate and rhythm, S1, S2 normal, no murmur, click, rub or gallop GI: normal findings: soft, non-tender The paitient has had one breathing treatment with Albuteral and she says that she feels no better.   Assessment/Plan: Upper respiratory infection.  Must rule out pneumonia. We discussed in hospital management  based on the CxR findings.  Patient agrees. We will try a second breathing treatment.  LOS: 0 days    Janine Limbo 04/24/2011, 5:56 PM

## 2011-04-24 NOTE — ED Notes (Signed)
X-ray paged.

## 2011-04-24 NOTE — ED Notes (Signed)
Respiratory informed ordered albuterol neb treatment;

## 2011-04-24 NOTE — ED Notes (Signed)
Neb treatment in progress.

## 2011-04-24 NOTE — ED Notes (Signed)
2nd breathing treatment  In progress (Atrovent 0.5 mg and Albuterol 2.5 mg); Waiting for chest x-ray;

## 2011-04-24 NOTE — ED Provider Notes (Signed)
History   25 y/o 16.6 wk SIUP with CC persistent cough x 3 weeks. Was seen @ Urgent care approx. 3 weeks ago and treated "for pneumonia" with z-pack and cough syrup. Initially had improvement, but then relapsed. Returned to Urgent care yesterday and was told she probably had a cold and given cough syrup again, with supportive therapy only. Feels as if she has become progressively worse and has SOB, and pain with coughing. Productive with "greenish" color. Denies fever, chills, malaise. States all her children are sick with similar complaints but have not been as severe. Pt admits to smoking hx approx 7-10/day. Stopped smoking approx 1 month ago, then started back after only about 10 days of quitting. Denies pregnancy issues.   Chief Complaint  Patient presents with  . Nasal Congestion   HPI  OB History    Grav Para Term Preterm Abortions TAB SAB Ect Mult Living   3 2 1 1      3       Past Medical History  Diagnosis Date  . Anxiety   . Endometriosis     Past Surgical History  Procedure Date  . Cesarean section   . Tonsillectomy   . Endometrial ablation   . Adenoidectomy     No family history on file.  History  Substance Use Topics  . Smoking status: Current Everyday Smoker -- 1.0 packs/day  . Smokeless tobacco: Not on file  . Alcohol Use: No    Allergies:  Allergies  Allergen Reactions  . Penicillins Other (See Comments)    Allergic reaction to the penicillin was something that happened to her as a child.  Told to her by her mother.    Prescriptions prior to admission  Medication Sig Dispense Refill  . acetaminophen (TYLENOL) 500 MG tablet Take 1,000 mg by mouth every 6 (six) hours as needed. Patient took medication for pain.       Marland Kitchen azithromycin (ZITHROMAX Z-PAK) 250 MG tablet Take 250 mg by mouth daily.        Marland Kitchen dextromethorphan 15 MG/5ML syrup Take 5 mLs by mouth.        . guaifenesin (ROBITUSSIN) 100 MG/5ML syrup Take 200 mg by mouth 3 (three) times daily as  needed.        Marland Kitchen ibuprofen (ADVIL,MOTRIN) 100 MG tablet Take 200 mg by mouth every 6 (six) hours as needed. Patient took medication for pain.       . prenatal vitamin w/FE, FA (PRENATAL 1 + 1) 27-1 MG TABS Take 1 tablet by mouth daily.          Review of Systems  Constitutional: Negative.   HENT: Positive for congestion and sore throat.   Eyes: Negative.   Respiratory: Positive for cough, sputum production, shortness of breath and wheezing.   Cardiovascular: Negative.   Gastrointestinal: Negative.   Genitourinary: Negative.   Musculoskeletal: Negative.   Skin: Negative.   Neurological: Negative.   Endo/Heme/Allergies: Negative.   Psychiatric/Behavioral: Negative.    Physical Exam   Blood pressure 124/57, pulse 82, temperature 98.7 F (37.1 C), temperature source Oral, resp. rate 18, height 5\' 6"  (1.676 m), weight 100.699 kg (222 lb), SpO2 93.00%.  Physical Exam  Constitutional: She is oriented to person, place, and time. She appears well-developed and well-nourished.  HENT:  Head: Normocephalic.  Neck: Normal range of motion. No tracheal deviation present.  Cardiovascular: Normal rate, regular rhythm and normal heart sounds.  Exam reveals no gallop and no friction rub.  No murmur heard. Respiratory: No stridor. She has wheezes (Bilat inspiratory and expiratory wheezes throughout all lung fields. LL worse).  GI: Soft. Bowel sounds are normal. She exhibits no distension and no mass. There is no tenderness. There is no rebound and no guarding.  Musculoskeletal: Normal range of motion. She exhibits no edema and no tenderness.  Neurological: She is alert and oriented to person, place, and time.  Skin: Skin is warm and dry. No rash noted. No erythema. No pallor.  Psychiatric: She has a normal mood and affect. Her behavior is normal. Judgment and thought content normal.  O2 Sat 95-96% on room air Pos FHRx2 150's Lab Results  Component Value Date   WBC 12.4* 04/24/2011   HGB 12.2  04/24/2011   HCT 35.4* 04/24/2011   MCV 91.7 04/24/2011   PLT 161 04/24/2011   MAU Course  Procedures    Assessment and Plan  1. 16.6 wk SIUP with Persistent cough x 3 weeks 2. Elevated WBC  Plan: Consulted with Dr. Stefano Gaul Albuterol Neb treatment Chest X-Ray PA and Lat (Shielded)  Anabel Halon 04/24/2011, 5:15 PM

## 2011-04-24 NOTE — ED Provider Notes (Signed)
S/P breathing treatment x 2 Pt reports min-mod relief but still c/o pain with deep inspiration Notes cough more productive since breathing treatments Reviewed Chest x-ray with pt  Reviewed chest x-ray findings with Dr. Stefano Gaul Lungs are clear and mildly hyperexpanded 1. Progressive Bronchitic changes 2. Stable mild changes of COPD 3. Stable mild pulmonary vascular congestion  A: Cough and Lung changes related to Smoking P: Discharge home Guaifenesin/CDN 10 ml q 6 hours prn cough F/U routine appt.

## 2011-04-24 NOTE — Progress Notes (Signed)
Pt has had a cough and cold for past month; pt was given a Z-pac rx but did not get any relief; c/o SOB sometimes;

## 2011-06-09 LAB — BASIC METABOLIC PANEL
BUN: 13
CO2: 27
Chloride: 107
Glucose, Bld: 91
Potassium: 4.4

## 2011-06-09 LAB — CBC
HCT: 39.2
MCHC: 33.6
MCV: 84.2
Platelets: 216
WBC: 8.5

## 2011-06-09 LAB — DIFFERENTIAL
Basophils Relative: 2 — ABNORMAL HIGH
Eosinophils Absolute: 0.3
Eosinophils Relative: 4
Lymphs Abs: 2.9

## 2011-06-17 LAB — CBC
MCHC: 33.6
MCV: 84.2
Platelets: 223
WBC: 9.1

## 2011-06-17 LAB — HCG, SERUM, QUALITATIVE: Preg, Serum: NEGATIVE

## 2011-09-01 NOTE — L&D Delivery Note (Signed)
Delivery Note Around 2320, pt's RN called to report pt with increasing rectal pressure, but not consistent.  At Midnight, pt c/c/+2, and with urge to push.  FHR stable w/ baseline of 120 at that time, and pushing started at 0008.  Pushed well to SVD at 12:31 AM a viable female "Duwayne Heck" was delivered via Vaginal, Spontaneous Delivery (Presentation: ;ROA  ).  LNC noted and reduced over fetal head w/o difficulty.  As attempted to give downward traction, delay in shoulders for less than 30 seconds; reduction maneuvers included McRoberts and HOB down.  Newborn w/ end second stage meconium as body delivered, and newborn w/o spontaneous cry and no tone, when placed on mom's abdomen at time of birth.  Quickly clamped cord and cut by FOB, and taken to warmer for further assistance w/ transition.  Code apgar called, but as NICU arrived, newborn was crying and status greatly improved.  Newborn has spontaneously moved both arms, including Lt arm which was in anterior position.  APGAR: 2, 8; weight 9 lb 6.3 oz (4260 g).   Placenta status: Intact, Spontaneous.  Cord: 3 vessels with the following complications: None.  Cord pH: 7.19  Anesthesia: Epidural  Episiotomy: None Lacerations: None Suture Repair: n/a Est. Blood Loss (mL):  Mom to postpartum.  Baby to nursery-stable. Patient's 24 hr urine still in progress.  Several elevated BP's post delivery, and will draw PIH labs at 0500 this AM.  Will CTO  BP closely.   Pt plans to try to Breastfeed, and family desires outpatient circumcision.   Paisley Grajeda H 10/07/2011, 2:55 AM

## 2011-09-28 ENCOUNTER — Inpatient Hospital Stay (HOSPITAL_COMMUNITY)
Admission: AD | Admit: 2011-09-28 | Discharge: 2011-09-28 | Disposition: A | Discharge status: Home / Self Care | Payer: Medicaid Other | Source: Ambulatory Visit | Attending: Obstetrics and Gynecology | Admitting: Obstetrics and Gynecology

## 2011-09-28 ENCOUNTER — Encounter (HOSPITAL_COMMUNITY): Payer: Self-pay | Admitting: *Deleted

## 2011-09-28 DIAGNOSIS — O1490 Unspecified pre-eclampsia, unspecified trimester: Secondary | ICD-10-CM

## 2011-09-28 DIAGNOSIS — Z22330 Carrier of Group B streptococcus: Secondary | ICD-10-CM | POA: Insufficient documentation

## 2011-09-28 DIAGNOSIS — O99891 Other specified diseases and conditions complicating pregnancy: Secondary | ICD-10-CM | POA: Insufficient documentation

## 2011-09-28 DIAGNOSIS — R030 Elevated blood-pressure reading, without diagnosis of hypertension: Secondary | ICD-10-CM | POA: Insufficient documentation

## 2011-09-28 HISTORY — DX: Gestational (pregnancy-induced) hypertension without significant proteinuria, unspecified trimester: O13.9

## 2011-09-28 MED ORDER — ACETAMINOPHEN-CODEINE #3 300-30 MG PO TABS
2.0000 | ORAL_TABLET | Freq: Once | ORAL | Status: AC | PRN
  Administered 2011-09-28: 2 via ORAL
  Filled 2011-09-28: qty 2

## 2011-09-28 NOTE — Progress Notes (Signed)
PT SAYS  SHE WA SAT OFFICE  TODAY- WANTED HER TO COME TO HOSPITAL- BUT PT HAD TO GO GET CHILDREN.  OFFICE CALLED  HER AT 6PM- WITH LABS RESULTS- TOLD TO COME TO HOSPITAL.  HAD PRE-ECLAMP  WITH LAST PREG.  HAS BEEN HAVING BAD H/A- DIZZY, SEES SPOTS..  IS COLLECTING 24 HR URINE.

## 2011-09-28 NOTE — ED Provider Notes (Signed)
History   [name] is a 26y.o. Obese white female who presents at 39.2 weeks per [facility] 10/04/11 for BP evaluation after being seen at office for routine visit today and BP=130/78.  She had PIH labs drawn at office and were WNL except platelets slightly lower than normal =145,000 (at NOB =203K).  Her weight has been steady the last 2 weeks at office visits.  She is currently collecting a 24 hr urine and will turn in at office tomorrow around 1230.  She does report frequent headaches, "black spots" in vision, and increased swelling in extremities recently.  No UTI s/s or RUQ pain, but does have persistent heartburn that she reports is unresponsive to meds; denies VB, LOF, resp c/o's, fever, or illness.  Accompanied by her husband to MAU.  Her husband had several questions and remarks about induction of labor.   Pt stated she is "miserable."  Per pt recall, her cx by Dr. Les Pou exam at office today =tight 1/long/high/posterior.    Pt reports GFM.   Pregnancy r/f:  1.  Prev PTD's (singleton at 36 weeks and twins at 34 weeks)  2.  Previous c/s with twin pregnancy--desires TOLAC  3.  GBS pos  4.  H/o abnl pap  5.  Obese  6.     HPI  OB History    Grav Para Term Preterm Abortions TAB SAB Ect Mult Living   3 2 1 1      3       Past Medical History  Diagnosis Date  . Anxiety   . Endometriosis     Past Surgical History  Procedure Date  . Cesarean section   . Tonsillectomy   . Endometrial ablation   . Adenoidectomy     No family history on file.  History  Substance Use Topics  . Smoking status: Current Everyday Smoker -- 1.0 packs/day  . Smokeless tobacco: Not on file  . Alcohol Use: No    Allergies:  Allergies  Allergen Reactions  . Penicillins Other (See Comments)    Allergic reaction to the penicillin was something that happened to her as a child.  Told to her by her mother.    Prescriptions prior to admission  Medication Sig Dispense Refill  . acetaminophen (TYLENOL) 500 MG  tablet Take 1,000 mg by mouth every 6 (six) hours as needed. Patient took medication for pain.       Marland Kitchen ibuprofen (ADVIL,MOTRIN) 100 MG tablet Take 200 mg by mouth every 6 (six) hours as needed. Patient took medication for pain.       . prenatal vitamin w/FE, FA (PRENATAL 1 + 1) 27-1 MG TABS Take 1 tablet by mouth daily.          ROS--see history above Physical Exam  .. Filed Vitals:   09/28/11 2206 09/28/11 2209 09/28/11 2222 09/28/11 2301  BP: 130/71 141/90 138/80 126/67  Pulse: 87 89 89 85  Temp:      TempSrc:      Resp: 18     Height:      Weight:       Physical Exam  Constitutional: She is oriented to person, place, and time. She appears well-developed and well-nourished. No distress.  Cardiovascular: Normal rate.   Respiratory: Effort normal.  GI: Soft.       gravid  Musculoskeletal: She exhibits edema.       1+ BLE slightly pitting edema; no pedal edema  Neurological: She is alert and oriented to person, place,  and time. She displays abnormal reflex.       LLE--brisk reflex; RLE WNL No clonus  Skin: Skin is warm and dry. She is not diaphoretic.   EFM:  130, reactive, moderate variability, 1 mild variable TOCO:  occ'l ctx MAU Course  Procedures 1.  NST 2.  Serial BP's 3.  Tylenol #3 2tabs po x1 for HA  Assessment and Plan  1.  IUP at 39.2 2.  1 elevated BP reading in MAU; others WNL 3.  Platelets low=145K at office earlier today, but other PIH labs WNL 3.  Desires TOLAC 4.  MOPS 5.  GBS pos 6.  Previous PTD x2  1.  Per c/w Dr. Rivard--d/c home to continue 24 hr urine collection and return to office 1/29 for growth u/s and BP check and drop off 24 hr urine 2.  PIH and labor precautions and FKC rev'd 3.  F/u prn; support given  Haeli Gerlich H 09/28/2011, 9:26 PM

## 2011-10-02 ENCOUNTER — Telehealth (HOSPITAL_COMMUNITY): Payer: Self-pay | Admitting: *Deleted

## 2011-10-02 ENCOUNTER — Encounter (HOSPITAL_COMMUNITY): Payer: Self-pay | Admitting: *Deleted

## 2011-10-02 NOTE — Telephone Encounter (Signed)
Preadmission screen  

## 2011-10-04 ENCOUNTER — Encounter (HOSPITAL_COMMUNITY): Payer: Self-pay

## 2011-10-04 ENCOUNTER — Other Ambulatory Visit: Payer: Self-pay | Admitting: Obstetrics and Gynecology

## 2011-10-04 ENCOUNTER — Inpatient Hospital Stay (HOSPITAL_COMMUNITY)
Admission: RE | Admit: 2011-10-04 | Discharge: 2011-10-09 | DRG: 775 | Disposition: A | Discharge status: Home / Self Care | Payer: Medicaid Other | Source: Ambulatory Visit | Attending: Obstetrics and Gynecology | Admitting: Obstetrics and Gynecology

## 2011-10-04 ENCOUNTER — Encounter (HOSPITAL_COMMUNITY): Payer: Self-pay | Admitting: Pharmacist

## 2011-10-04 DIAGNOSIS — O99892 Other specified diseases and conditions complicating childbirth: Secondary | ICD-10-CM | POA: Diagnosis present

## 2011-10-04 DIAGNOSIS — R030 Elevated blood-pressure reading, without diagnosis of hypertension: Secondary | ICD-10-CM | POA: Diagnosis present

## 2011-10-04 DIAGNOSIS — Z22330 Carrier of Group B streptococcus: Secondary | ICD-10-CM

## 2011-10-04 DIAGNOSIS — O9903 Anemia complicating the puerperium: Secondary | ICD-10-CM | POA: Diagnosis not present

## 2011-10-04 DIAGNOSIS — D649 Anemia, unspecified: Secondary | ICD-10-CM | POA: Diagnosis not present

## 2011-10-04 DIAGNOSIS — O34219 Maternal care for unspecified type scar from previous cesarean delivery: Principal | ICD-10-CM | POA: Diagnosis present

## 2011-10-04 DIAGNOSIS — O3660X0 Maternal care for excessive fetal growth, unspecified trimester, not applicable or unspecified: Secondary | ICD-10-CM | POA: Diagnosis present

## 2011-10-04 LAB — CBC
HCT: 34 % — ABNORMAL LOW (ref 36.0–46.0)
Hemoglobin: 11.4 g/dL — ABNORMAL LOW (ref 12.0–15.0)
MCH: 29.7 pg (ref 26.0–34.0)
MCHC: 33.5 g/dL (ref 30.0–36.0)
MCV: 88.5 fL (ref 78.0–100.0)

## 2011-10-04 LAB — COMPREHENSIVE METABOLIC PANEL
BUN: 6 mg/dL (ref 6–23)
CO2: 20 mEq/L (ref 19–32)
Calcium: 8.9 mg/dL (ref 8.4–10.5)
Creatinine, Ser: 0.57 mg/dL (ref 0.50–1.10)
GFR calc Af Amer: >90 mL/min (ref >90)
GFR calc non Af Amer: >90 mL/min (ref >90)
Glucose, Bld: 97 mg/dL (ref 70–99)

## 2011-10-04 LAB — URIC ACID: Uric Acid, Serum: 4.8 mg/dL (ref 2.4–7.0)

## 2011-10-04 LAB — LACTATE DEHYDROGENASE: LDH: 181 U/L (ref 94–250)

## 2011-10-04 MED ORDER — LACTATED RINGERS IV SOLN
INTRAVENOUS | Status: DC
  Administered 2011-10-04 – 2011-10-06 (×4): via INTRAVENOUS

## 2011-10-04 MED ORDER — BUTORPHANOL TARTRATE 2 MG/ML IJ SOLN
2.0000 mg | INTRAMUSCULAR | Status: DC | PRN
  Administered 2011-10-05 (×4): 2 mg via INTRAVENOUS
  Filled 2011-10-04 (×5): qty 1

## 2011-10-04 MED ORDER — CEFAZOLIN SODIUM-DEXTROSE 2-3 GM-% IV SOLR
2.0000 g | Freq: Once | INTRAVENOUS | Status: AC
  Administered 2011-10-04: 2 g via INTRAVENOUS
  Filled 2011-10-04: qty 50

## 2011-10-04 MED ORDER — ACETAMINOPHEN-CODEINE #3 300-30 MG PO TABS
1.0000 | ORAL_TABLET | ORAL | Status: DC | PRN
  Administered 2011-10-04 – 2011-10-06 (×2): 1 via ORAL
  Filled 2011-10-04 (×2): qty 1

## 2011-10-04 MED ORDER — CLINDAMYCIN PHOSPHATE 900 MG/50ML IV SOLN
900.0000 mg | Freq: Once | INTRAVENOUS | Status: DC
  Filled 2011-10-04: qty 50

## 2011-10-04 MED ORDER — LACTATED RINGERS IV SOLN: 500.0000 mL | INTRAVENOUS | Status: DC | PRN

## 2011-10-04 MED ORDER — ONDANSETRON HCL 4 MG/2ML IJ SOLN: 4.0000 mg | Freq: Four times a day (QID) | INTRAMUSCULAR | Status: DC | PRN

## 2011-10-04 MED ORDER — ZOLPIDEM TARTRATE 10 MG PO TABS
10.0000 mg | ORAL_TABLET | Freq: Every evening | ORAL | Status: DC | PRN
  Administered 2011-10-04: 10 mg via ORAL
  Filled 2011-10-04: qty 1

## 2011-10-04 MED ORDER — DIPHENHYDRAMINE HCL 25 MG PO CAPS: 25.0000 mg | ORAL_CAPSULE | Freq: Four times a day (QID) | ORAL | Status: DC | PRN

## 2011-10-04 MED ORDER — HYDROXYZINE HCL 50 MG/ML IM SOLN: 50.0000 mg | Freq: Four times a day (QID) | INTRAMUSCULAR | Status: DC | PRN

## 2011-10-04 MED ORDER — IBUPROFEN 600 MG PO TABS
600.0000 mg | ORAL_TABLET | Freq: Four times a day (QID) | ORAL | Status: DC | PRN
  Filled 2011-10-04: qty 1

## 2011-10-04 MED ORDER — HYDROXYZINE HCL 50 MG PO TABS: 50.0000 mg | ORAL_TABLET | Freq: Four times a day (QID) | ORAL | Status: DC | PRN

## 2011-10-04 MED ORDER — OXYTOCIN 20 UNITS IN LACTATED RINGERS INFUSION - SIMPLE
125.0000 mL/h | Freq: Once | INTRAVENOUS | Status: AC
  Administered 2011-10-07: 125 mL/h via INTRAVENOUS

## 2011-10-04 MED ORDER — OXYTOCIN BOLUS FROM INFUSION
500.0000 mL | Freq: Once | INTRAVENOUS | Status: DC
  Filled 2011-10-04: qty 500

## 2011-10-04 MED ORDER — ACETAMINOPHEN 325 MG PO TABS: 650.0000 mg | ORAL_TABLET | ORAL | Status: DC | PRN

## 2011-10-04 MED ORDER — OXYCODONE-ACETAMINOPHEN 5-325 MG PO TABS
1.0000 | ORAL_TABLET | ORAL | Status: DC | PRN
  Administered 2011-10-07: 1 via ORAL
  Filled 2011-10-04: qty 1

## 2011-10-04 MED ORDER — CITRIC ACID-SODIUM CITRATE 334-500 MG/5ML PO SOLN: 30.0000 mL | ORAL | Status: DC | PRN

## 2011-10-04 MED ORDER — LIDOCAINE HCL (PF) 1 % IJ SOLN
30.0000 mL | INTRAMUSCULAR | Status: DC | PRN
  Filled 2011-10-04: qty 30

## 2011-10-04 MED ORDER — FLEET ENEMA 7-19 GM/118ML RE ENEM: 1.0000 | ENEMA | RECTAL | Status: DC | PRN

## 2011-10-04 MED ORDER — CEFAZOLIN SODIUM 1-5 GM-% IV SOLN
1.0000 g | Freq: Three times a day (TID) | INTRAVENOUS | Status: DC
  Administered 2011-10-05 – 2011-10-06 (×6): 1 g via INTRAVENOUS
  Filled 2011-10-04 (×8): qty 50
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
## 2011-10-04 NOTE — Progress Notes (Addendum)
Feeling some pain with uc, headache better now but not gone, had ambein just now O Fhts 120 LTV mod accel     uc q 3-4 mild     abd soft, gravid, nt     Vag not assessed      Results for orders placed during the hospital encounter of 10/04/11 (from the past 24 hour(s))  CBC     Status: Abnormal   Collection Time   10/04/11  8:05 PM      Component Value Range   WBC 12.4 (*) 4.0 - 10.5 (K/uL)   RBC 3.84 (*) 3.87 - 5.11 (MIL/uL)   Hemoglobin 11.4 (*) 12.0 - 15.0 (g/dL)   HCT 96.0 (*) 45.4 - 46.0 (%)   MCV 88.5  78.0 - 100.0 (fL)   MCH 29.7  26.0 - 34.0 (pg)   MCHC 33.5  30.0 - 36.0 (g/dL)   RDW 09.8  11.9 - 14.7 (%)   Platelets 160  150 - 400 (K/uL)  COMPREHENSIVE METABOLIC PANEL     Status: Abnormal   Collection Time   10/04/11  8:05 PM      Component Value Range   Sodium 139  135 - 145 (mEq/L)   Potassium 3.6  3.5 - 5.1 (mEq/L)   Chloride 107  96 - 112 (mEq/L)   CO2 20  19 - 32 (mEq/L)   Glucose, Bld 97  70 - 99 (mg/dL)   BUN 6  6 - 23 (mg/dL)   Creatinine, Ser 8.29  0.50 - 1.10 (mg/dL)   Calcium 8.9  8.4 - 56.2 (mg/dL)   Total Protein 6.1  6.0 - 8.3 (g/dL)   Albumin 3.0 (*) 3.5 - 5.2 (g/dL)   AST 17  0 - 37 (U/L)   ALT 12  0 - 35 (U/L)   Alkaline Phosphatase 111  39 - 117 (U/L)   Total Bilirubin 0.2 (*) 0.3 - 1.2 (mg/dL)   GFR calc non Af Amer >90  >90 (mL/min)   GFR calc Af Amer >90  >90 (mL/min)  URIC ACID     Status: Normal   Collection Time   10/04/11  8:05 PM      Component Value Range   Uric Acid, Serum 4.8  2.4 - 7.0 (mg/dL)  LACTATE DEHYDROGENASE     Status: Normal   Collection Time   10/04/11  8:05 PM      Component Value Range   LD 181  94 - 250 (U/L)   A 40 week IUP     VBAC foley bulb induction     HTN  P continue care, PIH labs discussed with Dr. Su Hilt at birth suite at 0100. Lavera Guise, CNM
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
## 2011-10-05 ENCOUNTER — Encounter (HOSPITAL_COMMUNITY): Payer: Self-pay

## 2011-10-05 DIAGNOSIS — Z22330 Carrier of Group B streptococcus: Secondary | ICD-10-CM

## 2011-10-05 LAB — URINALYSIS, ROUTINE W REFLEX MICROSCOPIC
Bilirubin Urine: NEGATIVE
Ketones, ur: NEGATIVE mg/dL
Nitrite: NEGATIVE
Protein, ur: NEGATIVE mg/dL

## 2011-10-05 LAB — URINE MICROSCOPIC-ADD ON

## 2011-10-05 LAB — RPR: RPR Ser Ql: NONREACTIVE

## 2011-10-05 MED ORDER — DINOPROSTONE 10 MG VA INST
10.0000 mg | VAGINAL_INSERT | Freq: Once | VAGINAL | Status: AC
  Administered 2011-10-05: 10 mg via VAGINAL
  Filled 2011-10-05: qty 1

## 2011-10-05 MED ORDER — PHENYLEPHRINE 40 MCG/ML (10ML) SYRINGE FOR IV PUSH (FOR BLOOD PRESSURE SUPPORT): 80.0000 ug | PREFILLED_SYRINGE | INTRAVENOUS | Status: DC | PRN

## 2011-10-05 MED ORDER — LACTATED RINGERS IV SOLN
500.0000 mL | Freq: Once | INTRAVENOUS | Status: AC
  Administered 2011-10-06: 500 mL via INTRAVENOUS

## 2011-10-05 MED ORDER — FENTANYL 2.5 MCG/ML BUPIVACAINE 1/10 % EPIDURAL INFUSION (WH - ANES)
14.0000 mL/h | INTRAMUSCULAR | Status: DC
  Administered 2011-10-06: 14 mL/h via EPIDURAL
  Filled 2011-10-05 (×5): qty 60

## 2011-10-05 MED ORDER — DIPHENHYDRAMINE HCL 50 MG/ML IJ SOLN: 12.5000 mg | INTRAMUSCULAR | Status: DC | PRN

## 2011-10-05 MED ORDER — EPHEDRINE 5 MG/ML INJ
10.0000 mg | INTRAVENOUS | Status: DC | PRN
  Filled 2011-10-05 (×3): qty 4

## 2011-10-05 MED ORDER — OXYTOCIN 20 UNITS IN LACTATED RINGERS INFUSION - SIMPLE
1.0000 m[IU]/min | INTRAVENOUS | Status: DC
  Administered 2011-10-05: 1 m[IU]/min via INTRAVENOUS
  Administered 2011-10-06: 22 m[IU]/min via INTRAVENOUS
  Filled 2011-10-05 (×2): qty 1000

## 2011-10-05 MED ORDER — PHENYLEPHRINE 40 MCG/ML (10ML) SYRINGE FOR IV PUSH (FOR BLOOD PRESSURE SUPPORT)
80.0000 ug | PREFILLED_SYRINGE | INTRAVENOUS | Status: DC | PRN
  Filled 2011-10-05 (×3): qty 5

## 2011-10-05 MED ORDER — EPHEDRINE 5 MG/ML INJ: 10.0000 mg | INTRAVENOUS | Status: DC | PRN

## 2011-10-05 MED ORDER — TERBUTALINE SULFATE 1 MG/ML IJ SOLN: 0.2500 mg | Freq: Once | INTRAMUSCULAR | Status: AC | PRN

## 2011-10-05 NOTE — Progress Notes (Signed)
Asleep O bp 130-151/55-81     Fhts 120s LTV mod accels     uc q 2-6 A 40 1/7 week IUP     VBAC induction P not disturbed, continue care Lavera Guise, CNM

## 2011-10-05 NOTE — Progress Notes (Signed)
Headache is better after IV medication O Fhts 120s LTV mod accels     uc q 3-4 mild     abd soft, gravid, nt     Vag foley bulb removed intact and     Cervix very posterior only able to reach     Anterior lip of cervix A 40 17 week IUP for VBAC     Induction P IV pitocin discussed and pt concurs with plan of care     Collaboration with Dr. Su Hilt. Lavera Guise, CNM

## 2011-10-05 NOTE — Progress Notes (Signed)
S. Lillard oncoming shift update at 0600 on pt status as written. Lavera Guise, CNM

## 2011-10-05 NOTE — Progress Notes (Signed)
Patient ID: [name], female   DOB: [birth year], 26 y.o.   MRN: [id number] .Subjective: Doing well, denies need for pain meds but feeling ctx   Objective: BP 122/84  Pulse 69  Temp(Src) 97.9 F (36.6 C) (Oral)  Resp 18  Ht 5\' 6"  (1.676 m)  Wt 117.028 kg (258 lb)  BMI 41.64 kg/m2   FHT:  FHR: 130 bpm, variability: moderate,  accelerations:  Present,  decelerations:  Absent UC:   regular, every 2-3 minutes SVE:   Dilation: 4.5 Effacement (%): 60 Station: -3 Exam by:: Isac Sarna, RN, Winifred Olive, RN  Pitocin at 7mu  Assessment / Plan: Induction of labor due to srom,  progressing well on pitocin   Fetal Wellbeing:  Category I Pain Control:  Labor support without medications  Update physician PRN  [name] 10/05/2011, 9:17 AM

## 2011-10-05 NOTE — Progress Notes (Signed)
[name] is a 26 y.o. [phone] at 51w2dadmitted for induction of labor due to LGA.  Subjective:  Comfortable with  Labor support without medications, has received 3 doses of Stadol last at 3:30 pm Contractions every 2-4 minutes, lasting 30-50 seconds, intensity 5/10  With Pitocin at 27 mU/min Had Foley ripening through the night    Objective: BP 122/54  Pulse 73  Temp(Src) 97.7 F      FHT:  FHR: reviewed and reassuring  SVE:   Dilation: 3-4 Effacement (%): 50 Station: -3 Very posterior BISHOP = 3 Exam by:: Dr. Estanislado Pandy  Labs: Lab Results  Component Value Date   WBC 12.4* 10/04/2011   HGB 11.4* 10/04/2011   HCT 34.0* 10/04/2011   MCV 88.5 10/04/2011   PLT 160 10/04/2011    Assessment / Plan: 40 + 2 weeks with LGA No active labor despite FOLEY and Pitocin for 12 hours Fetal Wellbeing: reassuring  Long discussion with patient and husband / family reviewing options: same management, stop Pitocin and use other method of cervical ripening ( Cervidil) or proceed with repeat cesarean delivery. Risks and benefits reviewed including risk of uterine rupture, FTP and need to still proceed with c/s.   Patient would like to do Cervidil.  [name] A 10/05/2011, 7:15 PM

## 2011-10-05 NOTE — Progress Notes (Signed)
Patient ID: [name], female   DOB: July 16, 1986, 26 y.o.   MRN: [id number] .Subjective:  Stadol is helping some but not ready for epidural  Objective: BP 109/48  Pulse 66  Temp(Src) 98.1 F (36.7 C) (Oral)  Resp 20  Ht 5\' 6"  (1.676 m)  Wt 117.028 kg (258 lb)  BMI 41.64 kg/m2   FHT:  FHR: 115 bpm, variability: moderate,  accelerations:  Present,  decelerations:  Absent UC:   regular, every 2-3 minutes SVE:   Dilation: 3.5 Effacement (%): 60 Station: -3 Exam by:: [name], CNM    Assessment / Plan: Induction of labor due to term/hx c/s,  GBS pos  Cx is unchanged, very posterior, difficult to feel presenting part, confirm vtx with leopolds    Fetal Wellbeing:  Category I Pain Control:  stadol Continue with pitocin IOL Epidural PRN  Update physician PRN  [name] 10/05/2011, 2:09 PM

## 2011-10-06 ENCOUNTER — Inpatient Hospital Stay (HOSPITAL_COMMUNITY): Payer: Medicaid Other | Admitting: Anesthesiology

## 2011-10-06 ENCOUNTER — Encounter (HOSPITAL_COMMUNITY): Payer: Self-pay | Admitting: Anesthesiology

## 2011-10-06 LAB — CBC
HCT: 35.1 % — ABNORMAL LOW (ref 36.0–46.0)
MCH: 29.5 pg (ref 26.0–34.0)
MCHC: 33 g/dL (ref 30.0–36.0)
MCV: 89.3 fL (ref 78.0–100.0)
RDW: 15.2 % (ref 11.5–15.5)

## 2011-10-06 LAB — COMPREHENSIVE METABOLIC PANEL
Alkaline Phosphatase: 106 U/L (ref 39–117)
BUN: 8 mg/dL (ref 6–23)
Calcium: 8.9 mg/dL (ref 8.4–10.5)
Creatinine, Ser: 0.55 mg/dL (ref 0.50–1.10)
GFR calc Af Amer: >90 mL/min (ref >90)
Glucose, Bld: 80 mg/dL (ref 70–99)
Potassium: 3.9 mEq/L (ref 3.5–5.1)
Total Protein: 5.7 g/dL — ABNORMAL LOW (ref 6.0–8.3)

## 2011-10-06 LAB — URIC ACID: Uric Acid, Serum: 4.8 mg/dL (ref 2.4–7.0)

## 2011-10-06 MED ORDER — LIDOCAINE-EPINEPHRINE (PF) 2 %-1:200000 IJ SOLN
INTRAMUSCULAR | Status: DC | PRN
  Administered 2011-10-06: 3 mL via INTRADERMAL

## 2011-10-06 MED ORDER — FENTANYL 2.5 MCG/ML BUPIVACAINE 1/10 % EPIDURAL INFUSION (WH - ANES)
INTRAMUSCULAR | Status: DC | PRN
  Administered 2011-10-06: 14 mL/h via EPIDURAL

## 2011-10-06 MED ORDER — LIDOCAINE HCL 1.5 % IJ SOLN
INTRAMUSCULAR | Status: DC | PRN
  Administered 2011-10-06 (×2): 5 mL via EPIDURAL

## 2011-10-06 NOTE — Progress Notes (Signed)
Subjective: Pt currently sleeping.  Just before 2100, back pain and pain w/ contractions became severe, and called anesthesia for redose.  Pt was breathing heavily w/ ctxs, even more than she was earlier today before having epidural placed.    Objective: BP 100/65  Pulse 67  Temp(Src) 98.7 F (37.1 C) (Axillary)  Resp 20  Ht 5\' 6"  (1.676 m)  Wt 117.028 kg (258 lb)  BMI 41.64 kg/m2      FHT:  FHR: 130 bpm, variability: moderate,  accelerations:  Present,  decelerations:  Present mild to moderate variables noted around 2100; some w/ late component; some earlies and   UC:   regular, every 2-3 minutes SVE:   Dilation: 6 Effacement (%): 80 Station: -1 Exam by:: H. Haden Suder, CNM Small amt bloody show.  Amniotic fluid w/ less meconium.   Labs: Lab Results  Component Value Date   WBC 9.4 10/06/2011   HGB 11.6* 10/06/2011   HCT 35.1* 10/06/2011   MCV 89.3 10/06/2011   PLT 141* 10/06/2011    Assessment / Plan: Protracted active phase  Labor: Pit remains on 22mu and has been adeq MVU's since change of shift tonight Preeclampsia:  no signs or symptoms of toxicity and 24 hr urine completed at 2030, and pending Fetal Wellbeing:  Category II Pain Control:  Epidural I/D:  n/a Anticipated MOD:  NSVD Offered pt option of stopping TOLAC and proceeding w/ c/s around 2055, and pt desired to proceed w/ TOLAC at present.  Helped into exaggerated Sims position.   Will recheck in 1-2 hrs or prn. C/w MD prn. Eliud Polo H 10/06/2011, 10:32 PM

## 2011-10-06 NOTE — Progress Notes (Signed)
Subjective: Pt received epidural around 1515 and comfortable.  AROM after foley placed around 1626; MSF.  No PIH s/s.    Objective: BP 142/72  Pulse 82  Temp(Src) 98.2 F (36.8 C) (Oral)  Resp 20  Ht 5\' 6"  (1.676 m)  Wt 117.028 kg (258 lb)  BMI 41.64 kg/m2      FHT:  FHR: 125 bpm, variability: moderate,  accelerations:  Present,  decelerations:  Absent UC:   regular, every 2-3 minutes SVE:   Dilation: 3.5 Effacement (%): 70 Station: -2 Exam by:: Adele Dan, CNM  Assessment / Plan: 1.  IUP at 40.3  2.  MSF  3.  GBS pos  4.  Suspected LGA and borderline BP's x1wk  5. Serial induction  6.  TOLAC  Labor: early labor Preeclampsia:  no signs or symptoms of toxicity and Platelets=141 today; normal on admission; slightly decreased 1 wk ago=145 Fetal Wellbeing:  Category I Pain Control:  Epidural I/D:  n/a Anticipated MOD:  NSVD IUPC prn.  Titrate Pitocin prn.  C/w MD prn. Hajer Dwyer H 10/06/2011, 4:59 PM

## 2011-10-06 NOTE — Anesthesia Preprocedure Evaluation (Signed)
Anesthesia Evaluation  Patient identified by MRN, date of birth, ID band Patient awake    Reviewed: Allergy & Precautions, H&P , NPO status , Patient's Chart, lab work & pertinent test results  Airway Mallampati: III TM Distance: >3 FB Neck ROM: full    Dental No notable dental hx.    Pulmonary neg pulmonary ROS,  clear to auscultation  Pulmonary exam normal       Cardiovascular     Neuro/Psych PSYCHIATRIC DISORDERS Anxiety Depression    GI/Hepatic negative GI ROS, Neg liver ROS,   Endo/Other  Morbid obesity  Renal/GU negative Renal ROS  Genitourinary negative   Musculoskeletal negative musculoskeletal ROS (+)   Abdominal (+) obese,   Peds  Hematology negative hematology ROS (+)   Anesthesia Other Findings   Reproductive/Obstetrics (+) Pregnancy                           Anesthesia Physical Anesthesia Plan  ASA: III  Anesthesia Plan: Epidural   Post-op Pain Management:    Induction:   Airway Management Planned:   Additional Equipment:   Intra-op Plan:   Post-operative Plan:   Informed Consent: I have reviewed the patients History and Physical, chart, labs and discussed the procedure including the risks, benefits and alternatives for the proposed anesthesia with the patient or authorized representative who has indicated his/her understanding and acceptance.     Plan Discussed with:   Anesthesia Plan Comments:         Anesthesia Quick Evaluation

## 2011-10-06 NOTE — Anesthesia Procedure Notes (Signed)
Epidural Patient location during procedure: OB Start time: 10/06/2011 3:18 PM End time: 10/06/2011 3:24 PM Reason for block: procedure for pain  Staffing Anesthesiologist: Sandrea Hughs Performed by: anesthesiologist   Preanesthetic Checklist Completed: patient identified, site marked, surgical consent, pre-op evaluation, timeout performed, IV checked, risks and benefits discussed and monitors and equipment checked  Epidural Patient position: sitting Prep: site prepped and draped and DuraPrep Patient monitoring: continuous pulse ox and blood pressure Approach: midline Injection technique: LOR air  Needle:  Needle type: Tuohy  Needle gauge: 17 G Needle length: 9 cm Needle insertion depth: 7 cm Catheter type: closed end flexible Catheter size: 19 Gauge Catheter at skin depth: 13 cm Test dose: negative and 1.5% lidocaine  Assessment Sensory level: T8 Events: blood not aspirated, injection not painful, no injection resistance, negative IV test and no paresthesia

## 2011-10-06 NOTE — Significant Event (Signed)
Rapid Response Event Note  Overview: Time Called: 0055 Arrival Time: 0058 Event Type: Other (Comment) (active vaginal bleeding)  Initial Focused Assessment:   Interventions:   Event Summary:   at      at  Charted on WRONG PT - NO RAPID RESPONSE 2/4 AT 1610 ON THIS PT.        Jeannine Kitten

## 2011-10-06 NOTE — Progress Notes (Signed)
Subjective: Starting to feel intermittent rectal pressure and pain in back again despite epidural.  Pitocin just increased to 63mu/min.  Family remains supportive at bedside.  Pt reports pain feels like something "bunching up" in her lower back.  Helped to reposition pt after SVE.  Objective: BP 100/65  Pulse 67  Temp(Src) 98.7 F (37.1 C) (Axillary)  Resp 20  Ht 5\' 6"  (1.676 m)  Wt 117.028 kg (258 lb)  BMI 41.64 kg/m2      FHT:  FHR: 120 bpm, variability: moderate,  accelerations:  Present,  decelerations:  Present few earlies/mild variable over last hr UC:   regular, every 2-3 minutes SVE:   4.5/80/-2  Labs: Lab Results  Component Value Date   WBC 9.4 10/06/2011   HGB 11.6* 10/06/2011   HCT 35.1* 10/06/2011   MCV 89.3 10/06/2011   PLT 141* 10/06/2011   Assessment / Plan: 1.  IUP at 40.3  2.  early active labor  3.  inadequate pain control w/ epidural  4.  GBS pos  5. IOL for borderline PIH and LGA  Labor: early active Preeclampsia:  no signs or symptoms of toxicity and labs stable Fetal Wellbeing:  Category II Pain Control:  Epidural I/D:  n/a Anticipated MOD:  NSVD Hit PCAE, and repositioned pt.  Will CTO for labor progression and titrate MVU's to adequate ctxs. C/w MD/anesthesia prn. Soha Thorup H 10/06/2011, 10:26 PM

## 2011-10-06 NOTE — Progress Notes (Signed)
[name] is a 26 y.o. [phone] at [redacted]w[redacted]d admitted for borderline BP's x 1 week and suspected LGA.  Subjective: Pt able to rest some overnight.  3 female guests at bedside, and husband just returned.  No LOF or VB.  GFM.  No PIH s/s.  No labs since Sun night admission.    Objective: BP 140/91  Pulse 79  Temp(Src) 97.8 F (36.6 C) (Oral)  Resp 20  Ht 5\' 6"  (1.676 m)  Wt 117.028 kg (258 lb)  BMI 41.64 kg/m2     . Filed Vitals:   10/06/11 0400 10/06/11 0816 10/06/11 0902 10/06/11 1007  BP: 129/71 130/70 150/88 140/91  Pulse: 74 75 78 79  Temp: 98 F (36.7 C)  97.8 F (36.6 C)   TempSrc: Oral  Oral   Resp: 17  18 20   Height:      Weight:       FHT:  FHR: 130 bpm, variability: moderate,  accelerations:  Present,  decelerations:  Absent UC:   irregular, every 5-9 minutes SVE:   Dilation: 3.5 Effacement (%): 70 Station: -2 Exam by:: H. Kenleigh Toback, CNM Cervidil removed.  Assessment / Plan: 1. IUP at 40.3  2.  GBS pos  3.  Serial induction for borderline BP's and LGA  Labor: Restart Pitocin now Preeclampsia:  no signs or symptoms of toxicity and intake and ouput balanced Fetal Wellbeing:  Category I Pain Control:  Pt received IV Pain medicine yesterday; desires epidural when pain more intense today I/D:  n/a Anticipated MOD:  NSVD Will do PIH labs STAT now in anticipation of epidural.  Will place foley w/ SROM or w/ epidural, or before AROM in needed to continue 24 hr urine until 2030 tonight.   C/w MD prn. Jayston Trevino H 10/06/2011, 10:14 AM

## 2011-10-07 ENCOUNTER — Encounter (HOSPITAL_COMMUNITY): Payer: Self-pay

## 2011-10-07 DIAGNOSIS — O34219 Maternal care for unspecified type scar from previous cesarean delivery: Secondary | ICD-10-CM | POA: Diagnosis not present

## 2011-10-07 LAB — COMPREHENSIVE METABOLIC PANEL WITH GFR
ALT: 8 U/L (ref 0–35)
AST: 14 U/L (ref 0–37)
Albumin: 2.1 g/dL — ABNORMAL LOW (ref 3.5–5.2)
Alkaline Phosphatase: 82 U/L (ref 39–117)
BUN: 12 mg/dL (ref 6–23)
CO2: 21 meq/L (ref 19–32)
Calcium: 8.6 mg/dL (ref 8.4–10.5)
Chloride: 103 meq/L (ref 96–112)
Creatinine, Ser: 0.73 mg/dL (ref 0.50–1.10)
GFR calc Af Amer: >90 mL/min
GFR calc non Af Amer: >90 mL/min
Glucose, Bld: 187 mg/dL — ABNORMAL HIGH (ref 70–99)
Potassium: 3.8 meq/L (ref 3.5–5.1)
Sodium: 133 meq/L — ABNORMAL LOW (ref 135–145)
Total Bilirubin: 0.3 mg/dL (ref 0.3–1.2)
Total Protein: 4.9 g/dL — ABNORMAL LOW (ref 6.0–8.3)

## 2011-10-07 LAB — CBC
HCT: 27 % — ABNORMAL LOW (ref 36.0–46.0)
Hemoglobin: 9.2 g/dL — ABNORMAL LOW (ref 12.0–15.0)
RBC: 3.03 MIL/uL — ABNORMAL LOW (ref 3.87–5.11)
RDW: 15.4 % (ref 11.5–15.5)
WBC: 13.5 10*3/uL — ABNORMAL HIGH (ref 4.0–10.5)

## 2011-10-07 LAB — LACTATE DEHYDROGENASE: LDH: 164 U/L (ref 94–250)

## 2011-10-07 LAB — URIC ACID: Uric Acid, Serum: 5.3 mg/dL (ref 2.4–7.0)

## 2011-10-07 MED ORDER — ZOLPIDEM TARTRATE 5 MG PO TABS: 5.0000 mg | ORAL_TABLET | Freq: Every evening | ORAL | Status: DC | PRN

## 2011-10-07 MED ORDER — LANOLIN HYDROUS EX OINT: TOPICAL_OINTMENT | CUTANEOUS | Status: DC | PRN

## 2011-10-07 MED ORDER — WITCH HAZEL-GLYCERIN EX PADS: 1.0000 "application " | MEDICATED_PAD | CUTANEOUS | Status: DC | PRN

## 2011-10-07 MED ORDER — PRENATAL MULTIVITAMIN CH
1.0000 | ORAL_TABLET | Freq: Every day | ORAL | Status: DC
  Administered 2011-10-07 – 2011-10-09 (×3): 1 via ORAL
  Filled 2011-10-07 (×4): qty 1

## 2011-10-07 MED ORDER — SIMETHICONE 80 MG PO CHEW: 80.0000 mg | CHEWABLE_TABLET | ORAL | Status: DC | PRN

## 2011-10-07 MED ORDER — DIPHENHYDRAMINE HCL 25 MG PO CAPS: 25.0000 mg | ORAL_CAPSULE | Freq: Four times a day (QID) | ORAL | Status: DC | PRN

## 2011-10-07 MED ORDER — MEDROXYPROGESTERONE ACETATE 150 MG/ML IM SUSP: 150.0000 mg | INTRAMUSCULAR | Status: DC | PRN

## 2011-10-07 MED ORDER — ONDANSETRON HCL 4 MG/2ML IJ SOLN: 4.0000 mg | INTRAMUSCULAR | Status: DC | PRN

## 2011-10-07 MED ORDER — DIBUCAINE 1 % RE OINT: 1.0000 "application " | TOPICAL_OINTMENT | RECTAL | Status: DC | PRN

## 2011-10-07 MED ORDER — IBUPROFEN 600 MG PO TABS
600.0000 mg | ORAL_TABLET | Freq: Four times a day (QID) | ORAL | Status: DC
  Administered 2011-10-07 – 2011-10-09 (×11): 600 mg via ORAL
  Filled 2011-10-07 (×11): qty 1

## 2011-10-07 MED ORDER — SENNOSIDES-DOCUSATE SODIUM 8.6-50 MG PO TABS
2.0000 | ORAL_TABLET | Freq: Every day | ORAL | Status: DC
  Administered 2011-10-07 – 2011-10-08 (×2): 2 via ORAL

## 2011-10-07 MED ORDER — ONDANSETRON HCL 4 MG PO TABS: 4.0000 mg | ORAL_TABLET | ORAL | Status: DC | PRN

## 2011-10-07 MED ORDER — TETANUS-DIPHTH-ACELL PERTUSSIS 5-2.5-18.5 LF-MCG/0.5 IM SUSP
0.5000 mL | Freq: Once | INTRAMUSCULAR | Status: AC
  Administered 2011-10-08: 0.5 mL via INTRAMUSCULAR
  Filled 2011-10-07: qty 0.5

## 2011-10-07 MED ORDER — MAGNESIUM HYDROXIDE 400 MG/5ML PO SUSP: 30.0000 mL | ORAL | Status: DC | PRN

## 2011-10-07 MED ORDER — OXYCODONE-ACETAMINOPHEN 5-325 MG PO TABS
1.0000 | ORAL_TABLET | ORAL | Status: DC | PRN
  Administered 2011-10-07: 1 via ORAL
  Administered 2011-10-07 – 2011-10-09 (×11): 2 via ORAL
  Filled 2011-10-07 (×9): qty 2
  Filled 2011-10-07: qty 1
  Filled 2011-10-07 (×2): qty 2

## 2011-10-07 MED ORDER — BENZOCAINE-MENTHOL 20-0.5 % EX AERO: 1.0000 "application " | INHALATION_SPRAY | CUTANEOUS | Status: DC | PRN

## 2011-10-07 NOTE — Anesthesia Postprocedure Evaluation (Signed)
  Anesthesia Post-op Note  Patient: [name]  Procedure(s) Performed: * No procedures listed *  Patient Location: PACU and Mother/Baby  Anesthesia Type: Epidural  Level of Consciousness: awake, alert  and oriented  Airway and Oxygen Therapy: Patient Spontanous Breathing  Post-op Pain: mild  Post-op Assessment: Patient's Cardiovascular Status Stable, Respiratory Function Stable, No headache, No backache, No residual numbness and No residual motor weakness  Post-op Vital Signs: stable  Complications: No apparent anesthesia complications

## 2011-10-07 NOTE — Progress Notes (Signed)
UR chart review completed.  

## 2011-10-08 MED ORDER — FLUCONAZOLE 200 MG PO TABS: 200.0000 mg | ORAL_TABLET | Freq: Once | ORAL | Status: AC

## 2011-10-08 MED ORDER — IBUPROFEN 600 MG PO TABS: 600.0000 mg | ORAL_TABLET | Freq: Four times a day (QID) | ORAL | Status: AC | PRN

## 2011-10-08 MED ORDER — OXYCODONE-ACETAMINOPHEN 5-325 MG PO TABS: 1.0000 | ORAL_TABLET | ORAL | Status: AC | PRN

## 2011-10-08 NOTE — Progress Notes (Signed)
S comfortable, little bleeding, breastfeeding,  Undecided about birth control    Denies ha, visual spots or blurring, no epigastric pain O VSS     abd soft, nt     FF sm serosa flow, edema to suprapubic area     Perineum intact     - homan's sign bilaterally edema lower legs and feet A PP day 1 normal involution    Lactating    Anemia    Elevated bp resolved 24 hour protein 280 P bc options reviewed, frequent voids discussed, may want discharge tonight Lavera Guise, CNM

## 2011-10-09 MED ORDER — FERROUS SULFATE 325 (65 FE) MG PO TABS: 325.0000 mg | ORAL_TABLET | Freq: Two times a day (BID) | ORAL | Status: DC

## 2011-10-09 MED ORDER — NORETHINDRONE 0.35 MG PO TABS: 1.0000 | ORAL_TABLET | Freq: Every day | ORAL | Status: DC

## 2011-10-09 NOTE — Discharge Summary (Signed)
S: comfortable, little bleeding Denies ha, visual spots or blurring, no epigastric pain  O: VSS  abd soft, nt  FF sm serosa flow Perineum intact Negative Homan's  - homan's sign bilaterally edema lower legs and feet  A: Breastfeeding Anemia  Elevated bp resolved 24 hour protein 280  P: desires OCP's as BCM; Rx given advised patient to begin at 4 weeks PP

## 2011-10-09 NOTE — Progress Notes (Signed)
CLINICAL SOCIAL WORK  BRIEF PSYCHOSOCIAL ASSESSMENT  Referred by: CN    On: 10/09/11    For: Hx of Anxiety and PPD      _X_Patient Interview __Family Interview  _X_Other: chart   PSYCHOSOCIAL DATA:   Lives Alone  Lives with: 3 other children  Primary Support (Name/Relationship): Tammy Mcbane/MOB's stepmother Degree of support available:   CURRENT CONCERNS:     None noted Substance Abuse     _X_Behavioral Health Issues: Hx of Anx and PPD    Financial Resources     Abuse/Neglect/Domestic Violence   Cultural/Religious Issues     Post-Acute Placement    Adjustment to Illness     Knowledge/Cognitive Deficit      Other:     SOCIAL WORK ASSESSMENT/PLAN:  SW met with MOB in her first floor room to complete assessment and evaluate current emotional state.  SW ensured that MOB feels comfortable talking with her doctor if symptoms arise. SW discussed signs and symptoms of PPD and gave "Feelings After Birth" handout, which has information about community resources if needed.   _X_No Further Intervention Required  Psychosocial Support/Ongoing Assessment of Needs Information/Referral to [facility] Other  PATIENT'S/FAMILY'S RESPONSE TO PLAN OF CARE:  MOB reports feeling great and ready to go home.  She states that she has a good support system.  She told SW that FOB is the father of all of her children (20 year old son and 74 year old twin girls at home) but that they are not together at this time.  He is involved and supportive.  MOB's sister was here with her and her father came in while we were talking.  MOB stated that it was okay to talk in front of them.  MOB denies any current feelings of anxiety and depression.  She states that she did experience these symptoms when she got home from the hospital with her twins 7 years ago.  She reports that she had a 27 year old at home who was potty training and was completely overwhelmed with caring for him and two infants.  She states she has never taken  medication for anxiety or depression and does not think she needs any at this time.  She expects to have a different experience with this baby since her other children are much older now.  She states she feels comfortable with her doctor if symptoms arise and seemed appreciative of the information given and SW's visit.

## 2011-10-09 NOTE — Discharge Summary (Signed)
Obstetric Discharge Summary Reason for Admission: induction of labor Prenatal Procedures: ultrasound Intrapartum Procedures: GBS prophylaxis Postpartum Procedures: none Complications-Operative and Postpartum: none  Temp:  [98.3 F (36.8 C)] 98.3 F (36.8 C) (02/08 0555) Pulse Rate:  [85] 85  (02/08 0555) Resp:  [18] 18  (02/08 0555) BP: (121)/(73) 121/73 mmHg (02/08 0555) Hemoglobin  Date Value Range Status  10/07/2011 9.2* 12.0-15.0 (g/dL) Final     DELTA CHECK NOTED     REPEATED TO VERIFY     HCT  Date Value Range Status  10/07/2011 27.0* 36.0-46.0 (%) Final    Hospital Course:  Hospital Course: Admitted in labor fr induction of labor. Positive GBS. Progressed to fully dilated. Delivery was performed by C. Steelman without difficulty. Patient and baby tolerated the procedure without difficulty, no laceration noted. Infant to FTN. Mother and infant then had an uncomplicated postpartum course, with  breastfeeding going well. Mom's physical exam was WNL, and she was discharged home in stable condition. Contraception plan was Micronor.  She received adequate benefit from po pain medications.  Discharge Diagnoses: Term Pregnancy-delivered  S: comfortable, little bleeding  Denies ha, visual spots or blurring, no epigastric pain  O: VSS  abd soft, nt  FF sm serosa flow  Perineum intact  Negative Homan's  - homan's sign bilaterally edema lower legs and feet  A: Breastfeeding  Anemia  Elevated bp resolved 24 hour protein 280  P: desires OCP's as BCM; Rx given advised patient to begin at 4 weeks PP     Discharge Information: Date: 10/09/2011 Activity: lift nothing heavier than the baby x 6 weeks Diet: routine Medications:  Medication List  As of 10/09/2011 11:42 PM   START taking these medications         ferrous sulfate 325 (65 FE) MG tablet   Take 1 tablet (325 mg total) by mouth 2 (two) times daily.      fluconazole 200 MG tablet   Commonly known as: DIFLUCAN   Take 1  tablet (200 mg total) by mouth once.      ibuprofen 600 MG tablet   Commonly known as: ADVIL,MOTRIN   Take 1 tablet (600 mg total) by mouth every 6 (six) hours as needed for pain.      norethindrone 0.35 MG tablet   Commonly known as: MICRONOR,CAMILA,ERRIN   Take 1 tablet (0.35 mg total) by mouth daily.      oxyCODONE-acetaminophen 5-325 MG per tablet   Commonly known as: PERCOCET   Take 1-2 tablets by mouth every 3 (three) hours as needed (moderate - severe pain).         CONTINUE taking these medications         acetaminophen 500 MG tablet   Commonly known as: TYLENOL      prenatal multivitamin Tabs         STOP taking these medications         acetaminophen-codeine 300-30 MG per tablet          Where to get your medications    These are the prescriptions that you need to pick up.   You may get these medications from any pharmacy.         ferrous sulfate 325 (65 FE) MG tablet   fluconazole 200 MG tablet   ibuprofen 600 MG tablet   norethindrone 0.35 MG tablet   oxyCODONE-acetaminophen 5-325 MG per tablet           Condition: stable Instructions: refer to practice specific  booklet Discharge to: home Follow-up Information    Follow up with [facility] in 6 weeks.         Newborn Data: Live born    Kizzie Fantasia CORI 10/09/2011, 11:42 PM

## 2011-11-17 ENCOUNTER — Ambulatory Visit: Payer: Self-pay | Admitting: Obstetrics and Gynecology

## 2011-11-23 ENCOUNTER — Ambulatory Visit: Payer: Self-pay | Admitting: Obstetrics and Gynecology

## 2012-02-18 ENCOUNTER — Telehealth: Payer: Self-pay | Admitting: Obstetrics and Gynecology

## 2012-02-18 NOTE — Telephone Encounter (Signed)
Triage/cht received 

## 2012-02-18 NOTE — Telephone Encounter (Signed)
Lm on vm to cb per telephone call.  

## 2012-02-19 NOTE — Telephone Encounter (Signed)
Lm on vm to cb per telephone call.  

## 2012-02-23 ENCOUNTER — Telehealth: Payer: Self-pay

## 2012-02-23 NOTE — Telephone Encounter (Signed)
No response from pt regarding previous telephone call. Chart to fb.

## 2012-02-29 ENCOUNTER — Telehealth: Payer: Self-pay | Admitting: Obstetrics and Gynecology

## 2012-02-29 NOTE — Telephone Encounter (Signed)
TRIAGE/RX REQ °

## 2012-03-01 NOTE — Telephone Encounter (Signed)
Lm on vm tcb rgd msg 

## 2012-03-02 ENCOUNTER — Ambulatory Visit (INDEPENDENT_AMBULATORY_CARE_PROVIDER_SITE_OTHER): Payer: Medicaid Other | Admitting: Obstetrics and Gynecology

## 2012-03-02 ENCOUNTER — Encounter: Payer: Self-pay | Admitting: Obstetrics and Gynecology

## 2012-03-02 VITALS — BP 120/80 | Resp 16 | Ht 66.0 in | Wt 244.0 lb

## 2012-03-02 DIAGNOSIS — N760 Acute vaginitis: Secondary | ICD-10-CM | POA: Insufficient documentation

## 2012-03-02 DIAGNOSIS — L293 Anogenital pruritus, unspecified: Secondary | ICD-10-CM

## 2012-03-02 DIAGNOSIS — N898 Other specified noninflammatory disorders of vagina: Secondary | ICD-10-CM

## 2012-03-02 DIAGNOSIS — F411 Generalized anxiety disorder: Secondary | ICD-10-CM

## 2012-03-02 DIAGNOSIS — Z124 Encounter for screening for malignant neoplasm of cervix: Secondary | ICD-10-CM

## 2012-03-02 DIAGNOSIS — F419 Anxiety disorder, unspecified: Secondary | ICD-10-CM

## 2012-03-02 DIAGNOSIS — A499 Bacterial infection, unspecified: Secondary | ICD-10-CM

## 2012-03-02 DIAGNOSIS — Z2089 Contact with and (suspected) exposure to other communicable diseases: Secondary | ICD-10-CM

## 2012-03-02 DIAGNOSIS — Z0000 Encounter for general adult medical examination without abnormal findings: Secondary | ICD-10-CM

## 2012-03-02 DIAGNOSIS — Z202 Contact with and (suspected) exposure to infections with a predominantly sexual mode of transmission: Secondary | ICD-10-CM

## 2012-03-02 LAB — POCT WET PREP (WET MOUNT)
Bacteria Wet Prep HPF POC: POSITIVE
Clue Cells Wet Prep Whiff POC: NEGATIVE
pH: 4

## 2012-03-02 LAB — POCT URINALYSIS DIPSTICK
Glucose, UA: NEGATIVE
Ketones, UA: NEGATIVE
Spec Grav, UA: 1.01

## 2012-03-02 MED ORDER — SERTRALINE HCL 25 MG PO TABS: 25.0000 mg | ORAL_TABLET | Freq: Every day | ORAL | Status: DC

## 2012-03-02 MED ORDER — AZITHROMYCIN 500 MG PO TABS: 1000.0000 mg | ORAL_TABLET | Freq: Once | ORAL | Status: AC

## 2012-03-02 MED ORDER — METRONIDAZOLE 500 MG PO TABS: 500.0000 mg | ORAL_TABLET | Freq: Two times a day (BID) | ORAL | Status: AC

## 2012-03-02 NOTE — Telephone Encounter (Signed)
Spoke with pt rgd msg pt want appt for eval for vaginal itching pt has appt 03/02/12 at 3:00 with denise CNM pt voice understanding

## 2012-03-02 NOTE — Progress Notes (Signed)
Regular Periods: No, irregular on Micronor Mammogram: no  Monthly Breast Ex.: not every month Exercise: advised, not present not exercising  Tetanus < 10 years: Yes Seatbelts: advised and states that using same  NI. Bladder Functn.: normal Abuse at home: no  Daily BM's: irregular advised re dietary changes to assist, also advised use of Stool softener Stressful Work: no working at present  Healthy Diet: normal Sigmoid-Colonoscopy: never  Calcium: no - not desired Medical problems this year: c/o white vag d'c mild odor with itching x's 1 wk   LAST PAP:11/2009 Pap to day: 03/02/12 Subjective:    [name] is a 26 y.o. female, [id number], who presents for an annual exam.     History   Social History  . Marital Status: Single    Spouse Name: N/A    Number of Children: N/A  . Years of Education: N/A   Social History Main Topics  . Smoking status: Current Everyday Smoker -- 1.0 packs/day    Types: Cigarettes    Last Attempt to Quit: 01/30/2011  . Smokeless tobacco: Never Used  . Alcohol Use: No  . Drug Use: No  . Sexually Active: Yes    Birth Control/ Protection: Pill   Other Topics Concern  . None   Social History Narrative  . None    Menstrual cycle:   LMP: No LMP recorded. Patient is not currently having periods (Reason: Lactating).           Cycle: irregular and had had some break through bleeding with Micronor  The following portions of the patient's history were reviewed and updated as appropriate: allergies, current medications, past family history, past medical history, past social history, past surgical history and problem list.  Review of Systems Pertinent items are noted in HPI. Breast:Negative for breast lump,nipple discharge or nipple retraction Gastrointestinal: Negative for abdominal pain, change in bowel habits or rectal bleeding Urinary:negative   Objective:    BP 120/80  Resp 16  Ht 5\' 6"  (1.676 m)  Wt 244 lb (110.678 kg)  BMI 39.38 kg/m2   Breastfeeding? Yes    Weight:  Wt Readings from Last 1 Encounters:  03/02/12 244 lb (110.678 kg)          BMI: Body mass index is 39.38 kg/(m^2).  General Appearance: Alert, appropriate appearance for age. No acute distress HEENT: Grossly normal Neck / Thyroid: Supple, no masses, nodes or enlargement Lungs: clear to auscultation bilaterally Back: No CVA tenderness Breast Exam:No masses or nodes.No dimpling Cardiovascular: Regular rate and rhythm. S1, S2, no murmur Gastrointestinal: Soft, non-tender, no masses or organomegaly Pelvic Exam: Labia and surrounding area of skin red and inflamed(appears to have scratched skin) labs red and sl swollen, Vaginal walls slight inflammation Cervix; slight inflammation, Pap, GC & Chlamydia cultrtes done. Rectovaginal: normal Lymphatic Exam: Non-palpable nodes in neck, clavicular, axillary, or inguinal regions Skin: no rash or abnormalities Neurologic: Normal gait and speech, no tremor  Psychiatric: Alert and oriented, patient states that she having panic attacks at intervals mainly not coping with children and new born who is colicky Tx'ed : Zoloft 25mg s po daily x 1 week and to increase to 50 mgs po daily. To return x1 month for evaluation on this medication   Wet Prep: PH: 4 and Clue cells, WCC ++ with some red cells, Urinalysis: UPT - Neg as had admitted to intercourse after not taking Micronor correctly. UA neg. Stated burning with micurition UPT: neg   Assessment:    BV with  possible superimposed infection possibly Chlamydia Patient does not want to wait on test result wished to be tx'ed with Azithromycin Requested full STD screeen   Plan:   Pap - done STD screen - done GC/ Chlamydia - done Wet prep - done RPR - done Tx BV - Flagyl 500 mgs po x 7 days  Azithromycin 1gm x1 po ? Chlamydia Anxiety: Zoloft 25mg s po daily x1 wk and increase to 50mg s po daily To return in 1 month  Contraception:Micronor continued and has 5 refills       Connye Burkitt, DeniseMD   Contraception: Ortho Micronor  Mammogram:  never  PMH: no change  FMH: no change

## 2012-03-03 LAB — HIV ANTIBODY (ROUTINE TESTING W REFLEX): HIV: NONREACTIVE

## 2012-03-03 LAB — HEPATITIS C ANTIBODY: HCV Ab: REACTIVE — AB

## 2012-03-04 ENCOUNTER — Encounter: Payer: Self-pay | Admitting: Obstetrics and Gynecology

## 2012-03-04 DIAGNOSIS — R768 Other specified abnormal immunological findings in serum: Secondary | ICD-10-CM | POA: Insufficient documentation

## 2012-03-04 HISTORY — DX: Other specified abnormal immunological findings in serum: R76.8

## 2012-03-07 ENCOUNTER — Telehealth: Payer: Self-pay | Admitting: Obstetrics and Gynecology

## 2012-03-07 DIAGNOSIS — R768 Other specified abnormal immunological findings in serum: Secondary | ICD-10-CM

## 2012-03-07 LAB — PAP IG, CT-NG, RFX HPV ASCU: Chlamydia Probe Amp: NEGATIVE

## 2012-03-08 NOTE — Telephone Encounter (Signed)
Pt requesting lab results but they have not been reviewed yet. What would you like pt to do for plan of care?

## 2012-03-09 ENCOUNTER — Other Ambulatory Visit: Payer: Medicaid Other

## 2012-03-09 ENCOUNTER — Ambulatory Visit: Payer: Self-pay | Admitting: Obstetrics and Gynecology

## 2012-03-09 ENCOUNTER — Telehealth: Payer: Self-pay | Admitting: Obstetrics and Gynecology

## 2012-03-09 DIAGNOSIS — R768 Other specified abnormal immunological findings in serum: Secondary | ICD-10-CM

## 2012-03-09 NOTE — Telephone Encounter (Signed)
Tc to pt per test results. 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 Pt informed needs confirmatory testing for Hep C. Appt sched today with lab for confirmatory test for Hep C per [place]. Pt aware results takes approx 1 week to come back. Pt voices understanding.

## 2012-03-10 ENCOUNTER — Telehealth: Payer: Self-pay | Admitting: Obstetrics and Gynecology

## 2012-03-10 LAB — HEPATITIS C VRS RNA DETECT BY PCR-QUAL: Hepatitis C Vrs RNA by PCR-Qual: NEGATIVE

## 2012-03-10 NOTE — Telephone Encounter (Signed)
TRIAGE/LAB RES. °

## 2012-03-10 NOTE — Telephone Encounter (Signed)
TC to pt.  Informed test results not available.

## 2012-03-14 ENCOUNTER — Telehealth: Payer: Self-pay

## 2012-03-14 NOTE — Telephone Encounter (Signed)
Tc from pt per telephone call. Told pt Hep C confirmatory test -negative. Pt voices understanding.

## 2012-03-14 NOTE — Telephone Encounter (Signed)
Lm on vm to cb per test results.  

## 2012-03-18 ENCOUNTER — Ambulatory Visit: Payer: Self-pay | Admitting: Obstetrics and Gynecology

## 2012-03-30 ENCOUNTER — Encounter: Payer: Medicaid Other | Admitting: Obstetrics and Gynecology

## 2012-12-16 ENCOUNTER — Emergency Department (HOSPITAL_COMMUNITY)
Admission: EM | Admit: 2012-12-16 | Discharge: 2012-12-16 | Disposition: A | Discharge status: Home / Self Care | Payer: Medicaid Other | Source: Home / Self Care | Attending: Emergency Medicine | Admitting: Emergency Medicine

## 2012-12-16 ENCOUNTER — Encounter (HOSPITAL_COMMUNITY): Payer: Self-pay | Admitting: Emergency Medicine

## 2012-12-16 DIAGNOSIS — N390 Urinary tract infection, site not specified: Secondary | ICD-10-CM

## 2012-12-16 LAB — POCT PREGNANCY, URINE: Preg Test, Ur: NEGATIVE

## 2012-12-16 MED ORDER — HYDROCODONE-ACETAMINOPHEN 5-325 MG PO TABS: ORAL_TABLET | ORAL | Status: DC

## 2012-12-16 MED ORDER — ACETAMINOPHEN 325 MG PO TABS
ORAL_TABLET | ORAL | Status: AC
  Filled 2012-12-16: qty 2

## 2012-12-16 MED ORDER — ACETAMINOPHEN 325 MG PO TABS
650.0000 mg | ORAL_TABLET | Freq: Once | ORAL | Status: AC
  Administered 2012-12-16: 650 mg via ORAL

## 2012-12-16 MED ORDER — CIPROFLOXACIN HCL 500 MG PO TABS: 500.0000 mg | ORAL_TABLET | Freq: Two times a day (BID) | ORAL | Status: DC

## 2012-12-16 MED ORDER — PHENAZOPYRIDINE HCL 200 MG PO TABS: 200.0000 mg | ORAL_TABLET | Freq: Three times a day (TID) | ORAL | Status: DC | PRN

## 2012-12-16 NOTE — ED Notes (Signed)
Pain with urinating, onset of symptoms 2 weeks ago, has been drinking water and cranberry juice.  Reports symptoms have worsened.

## 2012-12-16 NOTE — ED Provider Notes (Signed)
Chief Complaint:   Chief Complaint  Patient presents with  . Urinary Tract Infection    History of Present Illness:   [name] is a 27 year old female who has had a two-week history of dysuria, frequency, nocturia, bladder pain and spasm typically after urination, and lower back pain. She denies fever, chills, nausea, vomiting, or hematuria. She's had no GYN complaints.  Review of Systems:  Other than noted above, the patient denies any of the following symptoms: General:  No fevers, chills, sweats, aches, or fatigue. GI:  No abdominal pain, back pain, nausea, vomiting, diarrhea, or constipation. GU:  No dysuria, frequency, urgency, hematuria, or incontinence. GYN:  No discharge, itching, vulvar pain or lesions, pelvic pain, or abnormal vaginal bleeding.  PMFSH:  Past medical history, family history, social history, meds, and allergies were reviewed.   Physical Exam:   Vital signs:  BP 130/91  Pulse 85  Temp(Src) 98.5 F (36.9 C) (Oral)  Resp 20  SpO2 100% Gen:  Alert, oriented, in no distress. Lungs:  Clear to auscultation, no wheezes, rales or rhonchi. Heart:  Regular rhythm, no gallop or murmer. Abdomen:  Flat and soft. There was slight suprapubic pain to palpation.  No guarding, or rebound.  No hepato-splenomegaly or mass.  Bowel sounds were normally active.  No hernia. Back:  No CVA tenderness.  Skin:  Clear, warm and dry.  Labs:   Results for orders placed during the hospital encounter of 12/16/12  POCT PREGNANCY, URINE      Result Value Range   Preg Test, Ur NEGATIVE  NEGATIVE    Dipstick urinalysis was negative for glucose and bilirubin. Negative for ketones, and had specific gravity 1.020 and pH of 5.5. There was moderate occult blood, 30 mg/dL of protein, positive for nitrites, and moderate leukocytes.  Other Labs Obtained at Urgent Care Center:  A urine culture was obtained.  Results are pending at this time and we will call about any positive results.  Course  in Urgent Care Center:  She was given Tylenol 650 mg by mouth for pain since she will be driving home.    Assessment: The encounter diagnosis was UTI (lower urinary tract infection).   Plan:   1.  The following meds were prescribed:   Discharge Medication List as of 12/16/2012  5:42 PM    START taking these medications   Details  ciprofloxacin (CIPRO) 500 MG tablet Take 1 tablet (500 mg total) by mouth every 12 (twelve) hours., Starting 12/16/2012, Until Discontinued, Normal    HYDROcodone-acetaminophen (NORCO/VICODIN) 5-325 MG per tablet 1 to 2 tabs every 4 to 6 hours as needed for pain., Print    phenazopyridine (PYRIDIUM) 200 MG tablet Take 1 tablet (200 mg total) by mouth 3 (three) times daily as needed for pain., Starting 12/16/2012, Until Discontinued, Normal       2.  The patient was instructed in symptomatic care and handouts were given. 3.  The patient was told to return if becoming worse in any way, if no better in 3 or 4 days, and given some red flag symptoms such as fever, chills, worsening pain, or persistent vomiting that would indicate earlier return. 4.  The patient was told to avoid intercourse for 10 days, get extra fluids, and return for a follow up with her primary care doctor at the completion of treatment for a repeat UA and culture.     Reuben Likes, MD 12/16/12 2031

## 2012-12-19 LAB — URINE CULTURE
Colony Count: >=100000
Special Requests: NORMAL

## 2012-12-19 NOTE — ED Notes (Signed)
Urine culture: >100,000 colonies E. Coli. Pt. adequately treated with Cipro. [name] 12/19/2012

## 2013-02-13 IMAGING — CR DG CHEST 2V
3 series · 3 of 3 positions shown · non-contrast
Comparison: 11/09/2010.

CLINICAL DATA: Cough and shortness of breath for the past 3 weeks.
17 weeks pregnant.  Smoker.

CHEST - 2 VIEW

[view not recorded (1 of 3)]
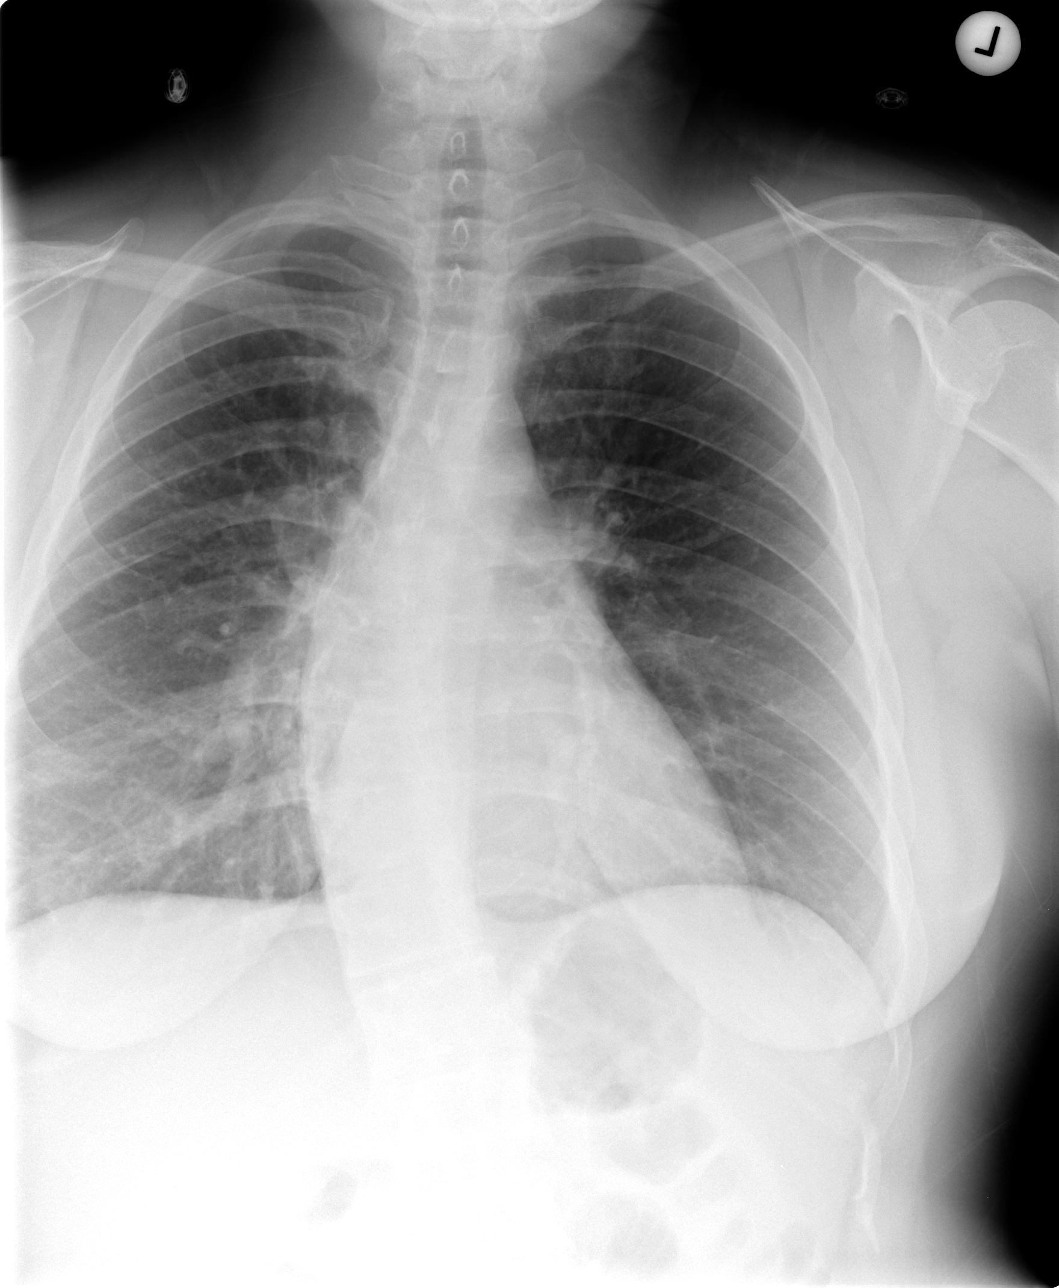

[view not recorded (2 of 3)]
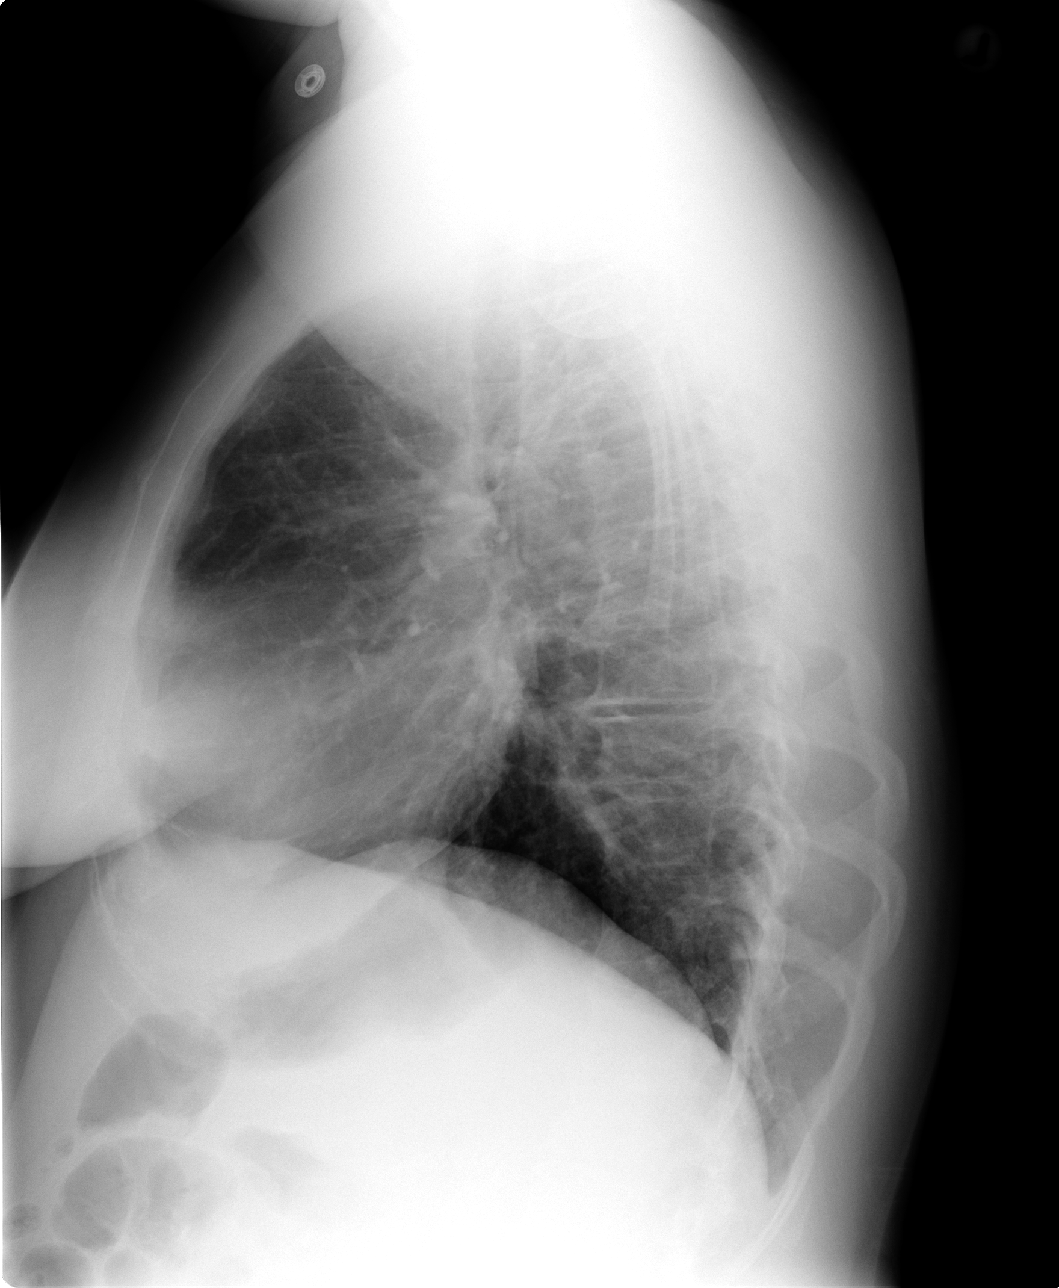

[view not recorded (3 of 3)]
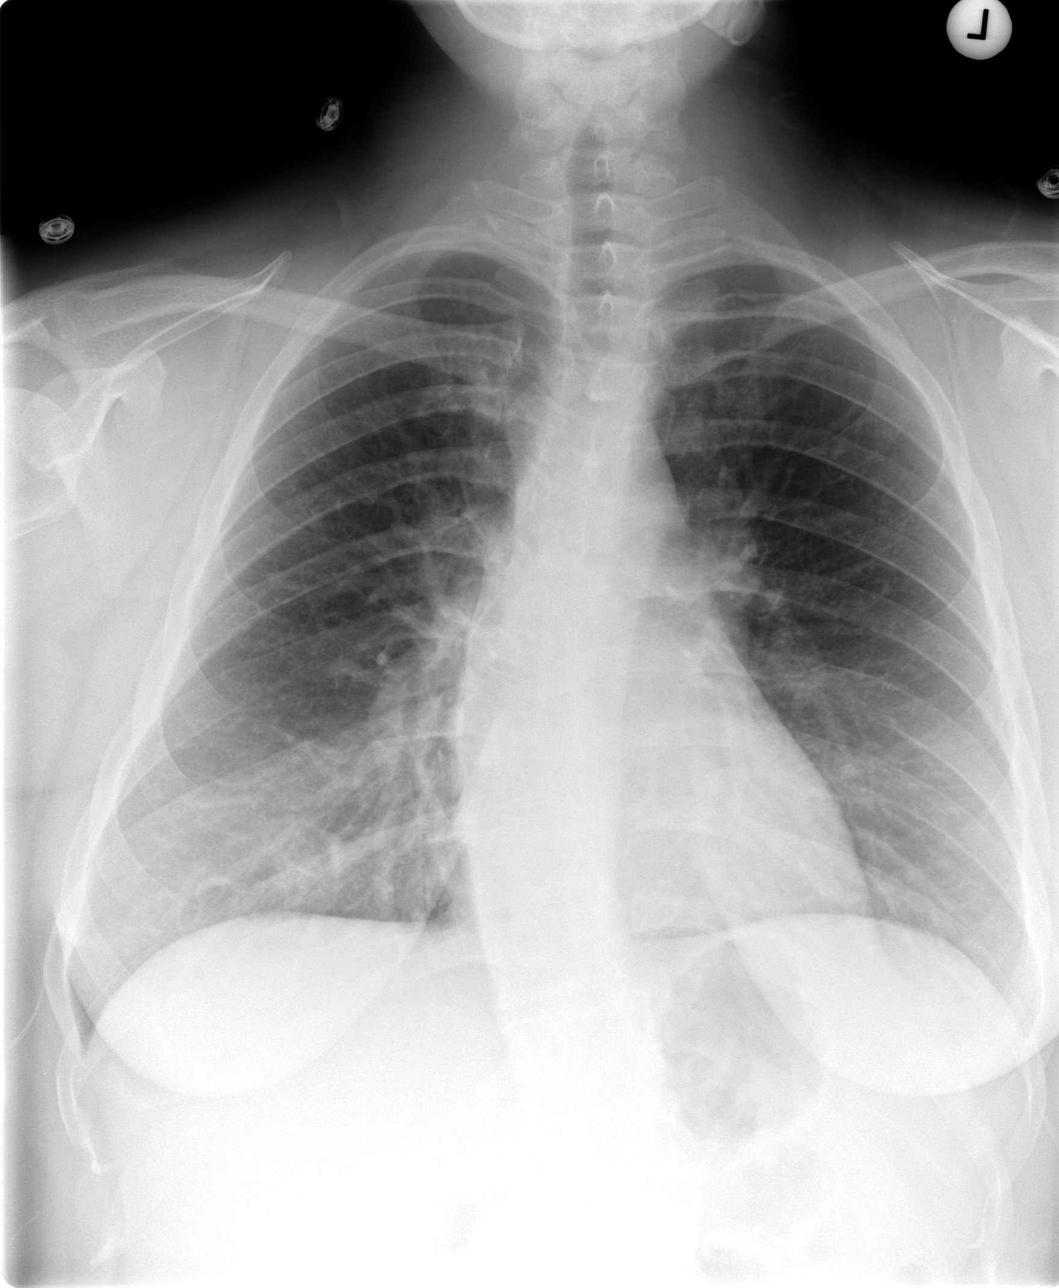

[3 of 3 positions shown; findings below may reference images not displayed]

FINDINGS: The heart remains normal in size.  The pulmonary
vasculature remains mildly prominent.  The lungs remain clear and
mildly hyperexpanded.  Mild to moderate diffuse peribronchial
thickening with progression.  Stable moderate scoliosis and mild
thoracic spine degenerative changes.
IMPRESSION: 1.  Progressive bronchitic changes.
2.  Stable mild changes of COPD.
3.  Stable mild pulmonary vascular congestion.

## 2013-07-17 LAB — OB RESULTS CONSOLE GC/CHLAMYDIA
CHLAMYDIA, DNA PROBE: NEGATIVE
GC PROBE AMP, GENITAL: NEGATIVE

## 2013-07-18 LAB — OB RESULTS CONSOLE RPR: RPR: NONREACTIVE

## 2013-07-18 LAB — OB RESULTS CONSOLE RUBELLA ANTIBODY, IGM: Rubella: IMMUNE

## 2013-07-18 LAB — OB RESULTS CONSOLE HIV ANTIBODY (ROUTINE TESTING): HIV: NONREACTIVE

## 2013-07-18 LAB — OB RESULTS CONSOLE ABO/RH: RH Type: POSITIVE

## 2013-07-18 LAB — OB RESULTS CONSOLE HEPATITIS B SURFACE ANTIGEN: Hepatitis B Surface Ag: NEGATIVE

## 2013-07-18 LAB — OB RESULTS CONSOLE ANTIBODY SCREEN: ANTIBODY SCREEN: NEGATIVE

## 2013-11-22 ENCOUNTER — Encounter: Payer: Self-pay | Admitting: Podiatry

## 2013-11-22 ENCOUNTER — Ambulatory Visit (INDEPENDENT_AMBULATORY_CARE_PROVIDER_SITE_OTHER): Payer: Medicaid Other | Admitting: Podiatry

## 2013-11-22 VITALS — BP 161/88 | HR 92 | Resp 20 | Ht 66.0 in | Wt 280.0 lb

## 2013-11-22 DIAGNOSIS — M722 Plantar fascial fibromatosis: Secondary | ICD-10-CM

## 2013-11-22 NOTE — Progress Notes (Signed)
   Subjective:    Patient ID: [name], female    DOB: 02-24-1986, 28 y.o.   MRN: [id number]  HPI N heel pain        L left plantar heel        D 2 months         O beginning of walking routine        C sharp pain        A worse after rest and in the morning, and after previous day of extra activity        T ice massage Pt states she is 6.5 months pregnant.  Review of Systems  Constitutional: Positive for appetite change.       6.5 months pregnant  HENT: Negative.   Eyes: Negative.   Respiratory: Negative.   Cardiovascular: Negative.   Gastrointestinal: Negative.   Endocrine: Negative.   Genitourinary: Negative.        Pregnant 6.5 months  Musculoskeletal: Negative.   Skin: Negative.   Allergic/Immunologic: Negative.   Neurological: Negative.   Hematological: Negative.   Psychiatric/Behavioral: Negative.        Objective:   Physical Exam        Assessment & Plan:

## 2013-11-22 NOTE — Patient Instructions (Signed)

## 2013-11-23 ENCOUNTER — Telehealth: Payer: Self-pay | Admitting: *Deleted

## 2013-11-23 NOTE — Telephone Encounter (Signed)
I was there yesterday.  Is there anything I can take for the pain?  I'm 6 months pregnant.  It's gotten worse with the brace he gave me.  I been wearing it all day.  I can't take Ibuprofen anymore because I'm at the 26 week mark.  I use [facility] on [address].  I told her I would call her back with a response.

## 2013-11-23 NOTE — Progress Notes (Signed)
Subjective:     Patient ID: [name], female   DOB: 09/29/1985, 29 y.o.   MRN: [id number]  HPI patient presents 6-1/2 months pregnant with intense heel pain of the left heel. States that it has been going on for about 2 months and getting worse   Review of Systems  All other systems reviewed and are negative.       Objective:   Physical Exam  Nursing note and vitals reviewed. Constitutional: She is oriented to person, place, and time.  Cardiovascular: Intact distal pulses.   Musculoskeletal: Normal range of motion.  Neurological: She is oriented to person, place, and time.  Skin: Skin is warm.   neurovascular status intact with discomfort in the plantar heel left at the insertion of the tendon into the calcaneus. Range of motion normal with muscle strength adequate and digits well perfused     Assessment:     Plantar fasciitis left heel with weight gain is probably part of the pathology secondary to pregnancy    Plan:     Reviewed H&P and discussed condition. Do not recommend x-rays in today applied fascially brace and gave instructions on physical therapy. I advised that if it does not improve for her to clear with her OB/GYN the usage of a steroidal injection to try to help her through this.

## 2013-11-24 NOTE — Telephone Encounter (Signed)
Patient called inquiring about prescription.  I informed her that Dr. Charlsie Merlesegal said he could prescribe something but needs to know from her Gynecologist what she can take.  I asked her to get them to fax us something.  She said she think they said Vicodin or Percocet.  She said she would call them.

## 2013-11-27 NOTE — Telephone Encounter (Signed)
I'm waiting to see if there's any medicine I can take for pain.  My Obstetrician was supposed to send over a fax of what I can take and authorization.  Give me a call back, I been suffering through the weekend.  Call back as soon as possible.  I returned her call and informed her that we have not received anything from her ob/gyn yet.  She said she was told by someone that it was here.  I told her I checked with medical records and they said nothing had come in.  She stated all right.

## 2013-11-29 MED ORDER — HYDROCODONE-ACETAMINOPHEN 5-300 MG PO TABS: 1.0000 | ORAL_TABLET | Freq: Four times a day (QID) | ORAL | Status: DC | PRN

## 2013-11-29 NOTE — Telephone Encounter (Signed)
Patient has called twice today about a prescription for pain medication.  Dr. Jake SeatsSikora okayed for her to have Vicodin 5/300  Sig: one PO PC q6 prn pain, quantity 40, 0 refills.  Patient was informed that she has to come by to pick it up.

## 2013-12-04 ENCOUNTER — Encounter: Payer: Self-pay | Admitting: Podiatry

## 2013-12-04 ENCOUNTER — Ambulatory Visit (INDEPENDENT_AMBULATORY_CARE_PROVIDER_SITE_OTHER): Payer: Medicaid Other | Admitting: Podiatry

## 2013-12-04 VITALS — BP 140/81 | HR 90 | Resp 19 | Ht 66.0 in | Wt 280.0 lb

## 2013-12-04 DIAGNOSIS — M722 Plantar fascial fibromatosis: Secondary | ICD-10-CM

## 2013-12-04 MED ORDER — TRIAMCINOLONE ACETONIDE 10 MG/ML IJ SUSP
10.0000 mg | Freq: Once | INTRAMUSCULAR | Status: AC
  Administered 2013-12-04: 10 mg

## 2013-12-04 NOTE — Progress Notes (Signed)
Subjective:     Patient ID: [name], female   DOB: [birth year], [age] y.o.   MRN: [id number]  HPI patient presents stating my left heel has been killing me and I have permission for an injection for my OB/GYN   Review of Systems     Objective:   Physical Exam Neurological vascular status intact with inflammation and discomfort around the medial plantar heel left with a letter stating a steroid injection is okay at the point she is at in her pregnancy    Assessment:     Severe plantar fasciitis left heel    Plan:     Injected the left plantar fascia 3 mg Kenalog 5 mg Xylocaine Marcaine mixture and dispensed night splint with instructions on usage

## 2014-01-05 ENCOUNTER — Inpatient Hospital Stay (HOSPITAL_COMMUNITY)
Admission: AD | Admit: 2014-01-05 | Discharge: 2014-01-05 | Disposition: A | Discharge status: Home / Self Care | Payer: Medicaid Other | Source: Ambulatory Visit | Attending: Obstetrics and Gynecology | Admitting: Obstetrics and Gynecology

## 2014-01-05 ENCOUNTER — Encounter (HOSPITAL_COMMUNITY): Payer: Self-pay

## 2014-01-05 DIAGNOSIS — Z87891 Personal history of nicotine dependence: Secondary | ICD-10-CM | POA: Insufficient documentation

## 2014-01-05 DIAGNOSIS — O10019 Pre-existing essential hypertension complicating pregnancy, unspecified trimester: Secondary | ICD-10-CM | POA: Insufficient documentation

## 2014-01-05 LAB — COMPREHENSIVE METABOLIC PANEL
ALBUMIN: 3.1 g/dL — AB (ref 3.5–5.2)
ALT: 10 U/L (ref 0–35)
AST: 15 U/L (ref 0–37)
Alkaline Phosphatase: 46 U/L (ref 39–117)
BUN: 8 mg/dL (ref 6–23)
CALCIUM: 9.3 mg/dL (ref 8.4–10.5)
CO2: 22 mEq/L (ref 19–32)
CREATININE: 0.56 mg/dL (ref 0.50–1.10)
Chloride: 102 mEq/L (ref 96–112)
GFR calc Af Amer: >90 mL/min (ref >90)
GFR calc non Af Amer: >90 mL/min (ref >90)
Glucose, Bld: 84 mg/dL (ref 70–99)
Potassium: 3.8 mEq/L (ref 3.7–5.3)
SODIUM: 138 meq/L (ref 137–147)
TOTAL PROTEIN: 6.5 g/dL (ref 6.0–8.3)
Total Bilirubin: 0.2 mg/dL — ABNORMAL LOW (ref 0.3–1.2)

## 2014-01-05 LAB — CBC
HCT: 33.7 % — ABNORMAL LOW (ref 36.0–46.0)
Hemoglobin: 11.5 g/dL — ABNORMAL LOW (ref 12.0–15.0)
MCH: 30.1 pg (ref 26.0–34.0)
MCHC: 34.1 g/dL (ref 30.0–36.0)
MCV: 88.2 fL (ref 78.0–100.0)
PLATELETS: 159 10*3/uL (ref 150–400)
RBC: 3.82 MIL/uL — AB (ref 3.87–5.11)
RDW: 14.3 % (ref 11.5–15.5)
WBC: 11.5 10*3/uL — ABNORMAL HIGH (ref 4.0–10.5)

## 2014-01-05 LAB — LACTATE DEHYDROGENASE: LDH: 152 U/L (ref 94–250)

## 2014-01-05 LAB — PROTEIN / CREATININE RATIO, URINE
CREATININE, URINE: 151.93 mg/dL
PROTEIN CREATININE RATIO: 0.15 (ref 0.00–0.15)
TOTAL PROTEIN, URINE: 23.4 mg/dL

## 2014-01-05 LAB — URIC ACID: Uric Acid, Serum: 6 mg/dL (ref 2.4–7.0)

## 2014-01-05 MED ORDER — ACETAMINOPHEN-CODEINE #3 300-30 MG PO TABS: 1.0000 | ORAL_TABLET | ORAL | Status: DC | PRN

## 2014-01-05 MED ORDER — ACETAMINOPHEN-CODEINE #3 300-30 MG PO TABS
1.0000 | ORAL_TABLET | ORAL | Status: DC | PRN
  Administered 2014-01-05: 1 via ORAL
  Filled 2014-01-05: qty 1

## 2014-01-05 NOTE — MAU Note (Signed)
PT SAYS SHE HAS BEEN HAVING  BLURRED VISIION X2.5 WEEKS.       DENIES   EPIGASTRIC PAIN.   HAS H/A   X2-3 WEEKS -  TOLD TO TAKE ZYRETEC-  NO RELIEF.    HAS BEEN TAKING TYLENOL- NO RELIEF.   TODAY  TOOK  2- 5OO MG  TABS.

## 2014-01-05 NOTE — MAU Note (Signed)
Patient states she has high blood pressure and is on 400 MG Labetalol bid. States she has been having headaches and blurred vision for about 2 1/2 weeks. Has some irregular contractions, no bleeding or leaking. Reports good fetal movement.

## 2014-01-05 NOTE — Discharge Instructions (Signed)
Hypertension During Pregnancy Hypertension is also called high blood pressure. It can occur at any time in life and during pregnancy. When you have hypertension, there is extra pressure inside your blood vessels that carry blood from the heart to the rest of your body (arteries). Hypertension during pregnancy can cause problems for you and your baby. Your baby might not weigh as much as it should at birth or might be born early (premature). Very bad cases of hypertension during pregnancy can be life threatening.  Different types of hypertension can occur during pregnancy.   Chronic hypertension. This happens when a woman has hypertension before pregnancy and it continues during pregnancy.  Gestational hypertension. This is when hypertension develops during pregnancy.  Preeclampsia or toxemia of pregnancy. This is a very serious type of hypertension that develops only during pregnancy. It is a disease that affects the whole body (systemic) and can be very dangerous for both mother and baby.  Gestational hypertension and preeclampsia usually go away after your baby is born. Blood pressure generally stabilizes within 6 weeks. Women who have hypertension during pregnancy have a greater chance of developing hypertension later in life or with future pregnancies. RISK FACTORS Some factors make you more likely to develop hypertension during pregnancy. Risk factors include:  Having hypertension before pregnancy.  Having hypertension during a previous pregnancy.  Being overweight.  Being older than 40.  Being pregnant with more than one baby (multiples).  Having diabetes or kidney problems. SIGNS AND SYMPTOMS Chronic and gestational hypertension rarely cause symptoms. Preeclampsia has symptoms, which may include:  Increased protein in your urine. Your health care provider will check for this at every prenatal visit.  Swelling of your hands and face.  Rapid weight gain.  Headaches.  Visual  changes.  Being bothered by light.  Abdominal pain, especially in the right upper area.  Chest pain.  Shortness of breath.  Increased reflexes.  Seizures. Seizures occur with a more severe form of preeclampsia, called eclampsia. DIAGNOSIS   You may be diagnosed with hypertension during a regular prenatal exam. At each visit, tests may include:  Blood pressure checks.  A urine test to check for protein in your urine.  The type of hypertension you are diagnosed with depends on when you developed it. It also depends on your specific blood pressure reading.  Developing hypertension before 20 weeks of pregnancy is consistent with chronic hypertension.  Developing hypertension after 20 weeks of pregnancy is consistent with gestational hypertension.  Hypertension with increased urinary protein is diagnosed as preeclampsia.  Blood pressure measurements that stay above 160 systolic or 110 diastolic are a sign of severe preeclampsia. TREATMENT Treatment for hypertension during pregnancy varies. Treatment depends on the type of hypertension and how serious it is.  If you take medicine for chronic hypertension, you may need to switch medicines.  Drugs called ACE inhibitors should not be taken during pregnancy.  Low-dose aspirin may be suggested for women who have risk factors for preeclampsia.  If you have gestational hypertension, you may need to take a blood pressure medicine that is safe during pregnancy. Your health care provider will recommend the appropriate medicine.  If you have severe preeclampsia, you may need to be in the hospital. Health care providers will watch you and the baby very closely. You also may need to take medicine (magnesium sulfate) to prevent seizures and lower blood pressure.  Sometimes an early delivery is needed. This may be the case if the condition worsens. It would   be done to protect you and the baby. The only cure for preeclampsia is delivery. HOME  CARE INSTRUCTIONS  Schedule and keep all of your regular appointments for prenatal care.  Only take over-the-counter or prescription medicines as directed by your health care provider. Tell your health care provider about all medicines you take.  Eat as little salt as possible.  Get regular exercise.  Do not drink alcohol.  Do not use tobacco products.  Do not drink products with caffeine.  Lie on your left side when resting. SEEK IMMEDIATE MEDICAL CARE IF:  You have severe abdominal pain.  You have sudden swelling in the hands, ankles, or face.  You gain 4 pounds (1.8 kg) or more in 1 week.  You vomit repeatedly.  You have vaginal bleeding.  You do not feel the baby moving as much.  You have a headache.  You have blurred or double vision.  You have muscle twitching or spasms.  You have shortness of breath.  You have blue fingernails and lips.  You have blood in your urine. MAKE SURE YOU:  Understand these instructions.  Will watch your condition.  Will get help right away if you are not doing well or get worse. Document Released: 05/05/2011 Document Revised: 06/07/2013 Document Reviewed: 03/16/2013 [employer] Patient Information 2014 [employer].  
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
## 2014-01-07 ENCOUNTER — Other Ambulatory Visit: Payer: Self-pay | Admitting: Obstetrics and Gynecology

## 2014-01-07 NOTE — Progress Notes (Signed)
Entered in error

## 2014-02-14 ENCOUNTER — Ambulatory Visit: Payer: Medicaid Other | Attending: Obstetrics and Gynecology | Admitting: Physical Therapy

## 2014-02-17 ENCOUNTER — Inpatient Hospital Stay (HOSPITAL_COMMUNITY)
Admission: AD | Admit: 2014-02-17 | Discharge: 2014-02-20 | DRG: 765 | Disposition: A | Discharge status: Home / Self Care | Payer: Medicaid Other | Source: Ambulatory Visit | Attending: Obstetrics and Gynecology | Admitting: Obstetrics and Gynecology

## 2014-02-17 ENCOUNTER — Encounter (HOSPITAL_COMMUNITY): Payer: Self-pay

## 2014-02-17 DIAGNOSIS — O99892 Other specified diseases and conditions complicating childbirth: Secondary | ICD-10-CM | POA: Diagnosis present

## 2014-02-17 DIAGNOSIS — O99810 Abnormal glucose complicating pregnancy: Secondary | ICD-10-CM | POA: Diagnosis present

## 2014-02-17 DIAGNOSIS — I10 Essential (primary) hypertension: Secondary | ICD-10-CM

## 2014-02-17 DIAGNOSIS — Z3490 Encounter for supervision of normal pregnancy, unspecified, unspecified trimester: Secondary | ICD-10-CM

## 2014-02-17 DIAGNOSIS — F341 Dysthymic disorder: Secondary | ICD-10-CM | POA: Diagnosis present

## 2014-02-17 DIAGNOSIS — Z833 Family history of diabetes mellitus: Secondary | ICD-10-CM

## 2014-02-17 DIAGNOSIS — O09299 Supervision of pregnancy with other poor reproductive or obstetric history, unspecified trimester: Secondary | ICD-10-CM

## 2014-02-17 DIAGNOSIS — O99344 Other mental disorders complicating childbirth: Secondary | ICD-10-CM | POA: Diagnosis present

## 2014-02-17 DIAGNOSIS — IMO0002 Reserved for concepts with insufficient information to code with codable children: Secondary | ICD-10-CM | POA: Diagnosis present

## 2014-02-17 DIAGNOSIS — Z98891 History of uterine scar from previous surgery: Secondary | ICD-10-CM

## 2014-02-17 DIAGNOSIS — O34219 Maternal care for unspecified type scar from previous cesarean delivery: Secondary | ICD-10-CM | POA: Diagnosis present

## 2014-02-17 DIAGNOSIS — B009 Herpesviral infection, unspecified: Secondary | ICD-10-CM | POA: Diagnosis present

## 2014-02-17 DIAGNOSIS — O9989 Other specified diseases and conditions complicating pregnancy, childbirth and the puerperium: Secondary | ICD-10-CM

## 2014-02-17 DIAGNOSIS — Z22330 Carrier of Group B streptococcus: Secondary | ICD-10-CM

## 2014-02-17 DIAGNOSIS — Z823 Family history of stroke: Secondary | ICD-10-CM

## 2014-02-17 DIAGNOSIS — A6000 Herpesviral infection of urogenital system, unspecified: Secondary | ICD-10-CM | POA: Diagnosis present

## 2014-02-17 DIAGNOSIS — Z8249 Family history of ischemic heart disease and other diseases of the circulatory system: Secondary | ICD-10-CM

## 2014-02-17 DIAGNOSIS — O99214 Obesity complicating childbirth: Secondary | ICD-10-CM

## 2014-02-17 DIAGNOSIS — Z6841 Body Mass Index (BMI) 40.0 and over, adult: Secondary | ICD-10-CM

## 2014-02-17 DIAGNOSIS — Z87891 Personal history of nicotine dependence: Secondary | ICD-10-CM

## 2014-02-17 DIAGNOSIS — E669 Obesity, unspecified: Secondary | ICD-10-CM | POA: Diagnosis present

## 2014-02-17 DIAGNOSIS — O3660X0 Maternal care for excessive fetal growth, unspecified trimester, not applicable or unspecified: Secondary | ICD-10-CM | POA: Diagnosis present

## 2014-02-17 DIAGNOSIS — O98519 Other viral diseases complicating pregnancy, unspecified trimester: Principal | ICD-10-CM | POA: Diagnosis present

## 2014-02-17 HISTORY — DX: Scoliosis, unspecified: M41.9

## 2014-02-17 HISTORY — DX: Essential (primary) hypertension: I10
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
## 2014-02-17 NOTE — MAU Note (Signed)
Water broke at 2300. Clear fld. Contractions.

## 2014-02-18 ENCOUNTER — Inpatient Hospital Stay (HOSPITAL_COMMUNITY): Payer: Medicaid Other | Admitting: Anesthesiology

## 2014-02-18 ENCOUNTER — Encounter (HOSPITAL_COMMUNITY): Payer: Self-pay | Admitting: *Deleted

## 2014-02-18 ENCOUNTER — Encounter (HOSPITAL_COMMUNITY): Payer: Medicaid Other | Admitting: Anesthesiology

## 2014-02-18 ENCOUNTER — Encounter (HOSPITAL_COMMUNITY)
Admission: AD | Disposition: A | Discharge status: Home / Self Care | Payer: Self-pay | Source: Ambulatory Visit | Attending: Obstetrics and Gynecology

## 2014-02-18 ENCOUNTER — Inpatient Hospital Stay (HOSPITAL_COMMUNITY): Payer: Medicaid Other

## 2014-02-18 DIAGNOSIS — Z3490 Encounter for supervision of normal pregnancy, unspecified, unspecified trimester: Secondary | ICD-10-CM

## 2014-02-18 DIAGNOSIS — Z98891 History of uterine scar from previous surgery: Secondary | ICD-10-CM

## 2014-02-18 LAB — PREPARE RBC (CROSSMATCH)

## 2014-02-18 LAB — COMPREHENSIVE METABOLIC PANEL
ALT: 17 U/L (ref 0–35)
AST: 23 U/L (ref 0–37)
Albumin: 3.1 g/dL — ABNORMAL LOW (ref 3.5–5.2)
Alkaline Phosphatase: 73 U/L (ref 39–117)
BUN: 10 mg/dL (ref 6–23)
CALCIUM: 9.9 mg/dL (ref 8.4–10.5)
CO2: 20 mEq/L (ref 19–32)
CREATININE: 0.67 mg/dL (ref 0.50–1.10)
Chloride: 105 mEq/L (ref 96–112)
GLUCOSE: 139 mg/dL — AB (ref 70–99)
Potassium: 3.9 mEq/L (ref 3.7–5.3)
Sodium: 139 mEq/L (ref 137–147)
Total Bilirubin: <0.2 mg/dL — ABNORMAL LOW (ref 0.3–1.2)
Total Protein: 6.4 g/dL (ref 6.0–8.3)

## 2014-02-18 LAB — OB RESULTS CONSOLE GBS: STREP GROUP B AG: POSITIVE

## 2014-02-18 LAB — CBC
HCT: 35.1 % — ABNORMAL LOW (ref 36.0–46.0)
Hemoglobin: 12 g/dL (ref 12.0–15.0)
MCH: 29.9 pg (ref 26.0–34.0)
MCHC: 34.2 g/dL (ref 30.0–36.0)
MCV: 87.5 fL (ref 78.0–100.0)
PLATELETS: 161 10*3/uL (ref 150–400)
RBC: 4.01 MIL/uL (ref 3.87–5.11)
RDW: 14.8 % (ref 11.5–15.5)
WBC: 11.1 10*3/uL — AB (ref 4.0–10.5)

## 2014-02-18 LAB — PROTEIN / CREATININE RATIO, URINE
CREATININE, URINE: 88.46 mg/dL
Protein Creatinine Ratio: 0.41 — ABNORMAL HIGH (ref 0.00–0.15)
Total Protein, Urine: 36.3 mg/dL

## 2014-02-18 LAB — LACTATE DEHYDROGENASE: LDH: 175 U/L (ref 94–250)

## 2014-02-18 LAB — RPR

## 2014-02-18 LAB — URIC ACID: Uric Acid, Serum: 6.5 mg/dL (ref 2.4–7.0)

## 2014-02-18 LAB — ABO/RH: ABO/RH(D): A POS

## 2014-02-18 SURGERY — Surgical Case
Anesthesia: Epidural

## 2014-02-18 MED ORDER — OXYTOCIN BOLUS FROM INFUSION
500.0000 mL | INTRAVENOUS | Status: DC
Start: 2014-02-18 — End: 2014-02-18

## 2014-02-18 MED ORDER — METOCLOPRAMIDE HCL 5 MG/ML IJ SOLN: 10.0000 mg | Freq: Three times a day (TID) | INTRAMUSCULAR | Status: DC | PRN

## 2014-02-18 MED ORDER — ONDANSETRON HCL 4 MG/2ML IJ SOLN: 4.0000 mg | Freq: Four times a day (QID) | INTRAMUSCULAR | Status: DC | PRN

## 2014-02-18 MED ORDER — LABETALOL HCL 200 MG PO TABS
400.0000 mg | ORAL_TABLET | Freq: Two times a day (BID) | ORAL | Status: DC
  Administered 2014-02-18: 400 mg via ORAL
  Filled 2014-02-18 (×4): qty 2

## 2014-02-18 MED ORDER — MIDAZOLAM HCL 2 MG/2ML IJ SOLN
INTRAMUSCULAR | Status: DC | PRN
  Administered 2014-02-18 (×2): 1 mg via INTRAVENOUS

## 2014-02-18 MED ORDER — OXYCODONE-ACETAMINOPHEN 5-325 MG PO TABS: 1.0000 | ORAL_TABLET | ORAL | Status: DC | PRN

## 2014-02-18 MED ORDER — BUTORPHANOL TARTRATE 1 MG/ML IJ SOLN
INTRAMUSCULAR | Status: AC
  Administered 2014-02-18: 1 mg
  Filled 2014-02-18: qty 1

## 2014-02-18 MED ORDER — ZOLPIDEM TARTRATE 5 MG PO TABS: 5.0000 mg | ORAL_TABLET | Freq: Every evening | ORAL | Status: DC | PRN

## 2014-02-18 MED ORDER — NALBUPHINE HCL 10 MG/ML IJ SOLN
5.0000 mg | INTRAMUSCULAR | Status: DC | PRN
  Administered 2014-02-18: 10 mg via SUBCUTANEOUS
  Filled 2014-02-18: qty 1

## 2014-02-18 MED ORDER — GENTAMICIN SULFATE 40 MG/ML IJ SOLN
INTRAVENOUS | Status: AC
  Administered 2014-02-18: 100 mL via INTRAVENOUS
  Filled 2014-02-18: qty 10.25

## 2014-02-18 MED ORDER — KETOROLAC TROMETHAMINE 30 MG/ML IJ SOLN
INTRAMUSCULAR | Status: AC
  Filled 2014-02-18: qty 1

## 2014-02-18 MED ORDER — SODIUM CHLORIDE 0.9 % IJ SOLN: 3.0000 mL | INTRAMUSCULAR | Status: DC | PRN

## 2014-02-18 MED ORDER — DIPHENHYDRAMINE HCL 25 MG PO CAPS: 25.0000 mg | ORAL_CAPSULE | Freq: Four times a day (QID) | ORAL | Status: DC | PRN

## 2014-02-18 MED ORDER — OXYCODONE-ACETAMINOPHEN 5-325 MG PO TABS
1.0000 | ORAL_TABLET | ORAL | Status: DC | PRN
  Administered 2014-02-19 – 2014-02-20 (×6): 2 via ORAL
  Filled 2014-02-18 (×2): qty 2
  Filled 2014-02-18: qty 1
  Filled 2014-02-18: qty 2
  Filled 2014-02-18: qty 1
  Filled 2014-02-18 (×2): qty 2

## 2014-02-18 MED ORDER — DIPHENHYDRAMINE HCL 50 MG/ML IJ SOLN: 25.0000 mg | INTRAMUSCULAR | Status: DC | PRN

## 2014-02-18 MED ORDER — ONDANSETRON HCL 4 MG/2ML IJ SOLN
INTRAMUSCULAR | Status: AC
  Filled 2014-02-18: qty 2

## 2014-02-18 MED ORDER — FENTANYL 2.5 MCG/ML BUPIVACAINE 1/10 % EPIDURAL INFUSION (WH - ANES)
14.0000 mL/h | INTRAMUSCULAR | Status: DC | PRN
  Administered 2014-02-18 (×2): 14 mL/h via EPIDURAL
  Filled 2014-02-18 (×2): qty 125

## 2014-02-18 MED ORDER — DIPHENHYDRAMINE HCL 50 MG/ML IJ SOLN: 12.5000 mg | INTRAMUSCULAR | Status: DC | PRN

## 2014-02-18 MED ORDER — KETOROLAC TROMETHAMINE 30 MG/ML IJ SOLN: 30.0000 mg | Freq: Four times a day (QID) | INTRAMUSCULAR | Status: DC | PRN

## 2014-02-18 MED ORDER — CHLOROPROCAINE HCL 1 % IJ SOLN
INTRAMUSCULAR | Status: DC | PRN
  Administered 2014-02-18 (×2): 10 mL

## 2014-02-18 MED ORDER — FERROUS SULFATE 325 (65 FE) MG PO TABS
325.0000 mg | ORAL_TABLET | Freq: Two times a day (BID) | ORAL | Status: DC
  Administered 2014-02-19 – 2014-02-20 (×3): 325 mg via ORAL
  Filled 2014-02-18 (×3): qty 1

## 2014-02-18 MED ORDER — TERBUTALINE SULFATE 1 MG/ML IJ SOLN
INTRAMUSCULAR | Status: AC
  Filled 2014-02-18: qty 1

## 2014-02-18 MED ORDER — IBUPROFEN 600 MG PO TABS
600.0000 mg | ORAL_TABLET | Freq: Four times a day (QID) | ORAL | Status: DC
  Administered 2014-02-19 – 2014-02-20 (×6): 600 mg via ORAL
  Filled 2014-02-18 (×6): qty 1

## 2014-02-18 MED ORDER — LANOLIN HYDROUS EX OINT: 1.0000 "application " | TOPICAL_OINTMENT | CUTANEOUS | Status: DC | PRN

## 2014-02-18 MED ORDER — MEPERIDINE HCL 25 MG/ML IJ SOLN: 6.2500 mg | INTRAMUSCULAR | Status: DC | PRN

## 2014-02-18 MED ORDER — LABETALOL HCL 200 MG PO TABS
400.0000 mg | ORAL_TABLET | Freq: Two times a day (BID) | ORAL | Status: DC
  Administered 2014-02-18 – 2014-02-20 (×4): 400 mg via ORAL
  Filled 2014-02-18 (×4): qty 2

## 2014-02-18 MED ORDER — OXYTOCIN 10 UNIT/ML IJ SOLN
40.0000 [IU] | INTRAVENOUS | Status: DC | PRN
  Administered 2014-02-18: 40 [IU] via INTRAVENOUS

## 2014-02-18 MED ORDER — LIDOCAINE-EPINEPHRINE (PF) 2 %-1:200000 IJ SOLN
INTRAMUSCULAR | Status: AC
  Filled 2014-02-18: qty 20

## 2014-02-18 MED ORDER — PHENYLEPHRINE 40 MCG/ML (10ML) SYRINGE FOR IV PUSH (FOR BLOOD PRESSURE SUPPORT)
80.0000 ug | PREFILLED_SYRINGE | INTRAVENOUS | Status: DC | PRN
  Filled 2014-02-18: qty 10

## 2014-02-18 MED ORDER — LACTATED RINGERS IV SOLN: 500.0000 mL | Freq: Once | INTRAVENOUS | Status: DC

## 2014-02-18 MED ORDER — SODIUM BICARBONATE 8.4 % IV SOLN
INTRAVENOUS | Status: AC
  Filled 2014-02-18: qty 50

## 2014-02-18 MED ORDER — SENNOSIDES-DOCUSATE SODIUM 8.6-50 MG PO TABS
2.0000 | ORAL_TABLET | ORAL | Status: DC
  Administered 2014-02-18 – 2014-02-19 (×2): 2 via ORAL
  Filled 2014-02-18 (×2): qty 2

## 2014-02-18 MED ORDER — MENTHOL 3 MG MT LOZG: 1.0000 | LOZENGE | OROMUCOSAL | Status: DC | PRN

## 2014-02-18 MED ORDER — HYDROMORPHONE HCL PF 1 MG/ML IJ SOLN: 0.2500 mg | INTRAMUSCULAR | Status: DC | PRN

## 2014-02-18 MED ORDER — SIMETHICONE 80 MG PO CHEW
80.0000 mg | CHEWABLE_TABLET | ORAL | Status: DC
  Administered 2014-02-18 – 2014-02-19 (×2): 80 mg via ORAL
  Filled 2014-02-18 (×2): qty 1

## 2014-02-18 MED ORDER — PRENATAL MULTIVITAMIN CH
1.0000 | ORAL_TABLET | Freq: Every day | ORAL | Status: DC
  Filled 2014-02-18: qty 1

## 2014-02-18 MED ORDER — ONDANSETRON HCL 4 MG/2ML IJ SOLN
INTRAMUSCULAR | Status: DC | PRN
Start: 2014-02-18 — End: 2014-02-18
  Administered 2014-02-18: 4 mg via INTRAVENOUS

## 2014-02-18 MED ORDER — VANCOMYCIN HCL IN DEXTROSE 1-5 GM/200ML-% IV SOLN
1000.0000 mg | Freq: Two times a day (BID) | INTRAVENOUS | Status: DC
  Administered 2014-02-18: 1000 mg via INTRAVENOUS
  Filled 2014-02-18 (×3): qty 200

## 2014-02-18 MED ORDER — MORPHINE SULFATE 0.5 MG/ML IJ SOLN
INTRAMUSCULAR | Status: AC
  Filled 2014-02-18: qty 10

## 2014-02-18 MED ORDER — TERBUTALINE SULFATE 1 MG/ML IJ SOLN: 0.2500 mg | Freq: Once | INTRAMUSCULAR | Status: DC | PRN

## 2014-02-18 MED ORDER — NALOXONE HCL 1 MG/ML IJ SOLN: 1.0000 ug/kg/h | INTRAVENOUS | Status: DC | PRN

## 2014-02-18 MED ORDER — KETOROLAC TROMETHAMINE 30 MG/ML IJ SOLN
30.0000 mg | Freq: Four times a day (QID) | INTRAMUSCULAR | Status: DC | PRN
  Administered 2014-02-18: 30 mg via INTRAMUSCULAR

## 2014-02-18 MED ORDER — ONDANSETRON HCL 4 MG/2ML IJ SOLN
4.0000 mg | Freq: Three times a day (TID) | INTRAMUSCULAR | Status: DC | PRN
Start: 2014-02-18 — End: 2014-02-20

## 2014-02-18 MED ORDER — DIPHENHYDRAMINE HCL 25 MG PO CAPS: 25.0000 mg | ORAL_CAPSULE | ORAL | Status: DC | PRN

## 2014-02-18 MED ORDER — LIDOCAINE HCL (PF) 1 % IJ SOLN
30.0000 mL | INTRAMUSCULAR | Status: AC | PRN
  Administered 2014-02-18 (×2): 5 mL via SUBCUTANEOUS

## 2014-02-18 MED ORDER — SCOPOLAMINE 1 MG/3DAYS TD PT72
1.0000 | MEDICATED_PATCH | Freq: Once | TRANSDERMAL | Status: DC
  Administered 2014-02-18: 1.5 mg via TRANSDERMAL

## 2014-02-18 MED ORDER — NALBUPHINE HCL 10 MG/ML IJ SOLN: 5.0000 mg | INTRAMUSCULAR | Status: DC | PRN

## 2014-02-18 MED ORDER — SCOPOLAMINE 1 MG/3DAYS TD PT72
MEDICATED_PATCH | TRANSDERMAL | Status: AC
  Filled 2014-02-18: qty 1

## 2014-02-18 MED ORDER — CITRIC ACID-SODIUM CITRATE 334-500 MG/5ML PO SOLN
30.0000 mL | ORAL | Status: DC | PRN
  Administered 2014-02-18: 30 mL via ORAL
  Filled 2014-02-18: qty 15

## 2014-02-18 MED ORDER — PHENYLEPHRINE 40 MCG/ML (10ML) SYRINGE FOR IV PUSH (FOR BLOOD PRESSURE SUPPORT): 80.0000 ug | PREFILLED_SYRINGE | INTRAVENOUS | Status: DC | PRN

## 2014-02-18 MED ORDER — OXYTOCIN 10 UNIT/ML IJ SOLN
INTRAMUSCULAR | Status: AC
Start: 2014-02-18 — End: 2014-02-18
  Filled 2014-02-18: qty 4

## 2014-02-18 MED ORDER — ONDANSETRON HCL 4 MG PO TABS: 4.0000 mg | ORAL_TABLET | ORAL | Status: DC | PRN

## 2014-02-18 MED ORDER — OXYTOCIN 40 UNITS IN LACTATED RINGERS INFUSION - SIMPLE MED
62.5000 mL/h | INTRAVENOUS | Status: DC
  Filled 2014-02-18: qty 1000

## 2014-02-18 MED ORDER — LIDOCAINE HCL (PF) 1 % IJ SOLN
INTRAMUSCULAR | Status: AC
  Filled 2014-02-18: qty 30

## 2014-02-18 MED ORDER — SIMETHICONE 80 MG PO CHEW
80.0000 mg | CHEWABLE_TABLET | Freq: Three times a day (TID) | ORAL | Status: DC
  Administered 2014-02-19 – 2014-02-20 (×4): 80 mg via ORAL
  Filled 2014-02-18 (×4): qty 1

## 2014-02-18 MED ORDER — WITCH HAZEL-GLYCERIN EX PADS: 1.0000 "application " | MEDICATED_PAD | CUTANEOUS | Status: DC | PRN

## 2014-02-18 MED ORDER — EPHEDRINE 5 MG/ML INJ: 10.0000 mg | INTRAVENOUS | Status: DC | PRN

## 2014-02-18 MED ORDER — EPHEDRINE 5 MG/ML INJ
10.0000 mg | INTRAVENOUS | Status: DC | PRN
  Filled 2014-02-18: qty 4

## 2014-02-18 MED ORDER — LACTATED RINGERS IV SOLN: 500.0000 mL | INTRAVENOUS | Status: DC | PRN

## 2014-02-18 MED ORDER — MORPHINE SULFATE (PF) 0.5 MG/ML IJ SOLN
INTRAMUSCULAR | Status: DC | PRN
  Administered 2014-02-18: 1 mg via EPIDURAL
  Administered 2014-02-18: 3 mg via EPIDURAL
  Administered 2014-02-18: 1 mg via INTRAVENOUS

## 2014-02-18 MED ORDER — SODIUM BICARBONATE 8.4 % IV SOLN
INTRAVENOUS | Status: DC | PRN
  Administered 2014-02-18: 3 mL via EPIDURAL
  Administered 2014-02-18 (×3): 5 mL via EPIDURAL
  Administered 2014-02-18: 7 mL via EPIDURAL
  Administered 2014-02-18: 5 mL via EPIDURAL

## 2014-02-18 MED ORDER — CHLOROPROCAINE HCL 3 % IJ SOLN
INTRAMUSCULAR | Status: AC
  Filled 2014-02-18: qty 40

## 2014-02-18 MED ORDER — LACTATED RINGERS IV SOLN
INTRAVENOUS | Status: DC | PRN
  Administered 2014-02-18: 14:00:00 via INTRAVENOUS

## 2014-02-18 MED ORDER — LACTATED RINGERS IV SOLN
INTRAVENOUS | Status: DC
  Administered 2014-02-19: 02:00:00 via INTRAVENOUS

## 2014-02-18 MED ORDER — LACTATED RINGERS IV SOLN
INTRAVENOUS | Status: DC
  Administered 2014-02-18 (×2): via INTRAVENOUS

## 2014-02-18 MED ORDER — SIMETHICONE 80 MG PO CHEW
80.0000 mg | CHEWABLE_TABLET | ORAL | Status: DC | PRN
  Filled 2014-02-18: qty 1

## 2014-02-18 MED ORDER — KETAMINE HCL 10 MG/ML IJ SOLN
INTRAMUSCULAR | Status: DC | PRN
  Administered 2014-02-18: 10 mg via INTRAVENOUS
  Administered 2014-02-18 (×3): 20 mg via INTRAVENOUS
  Administered 2014-02-18: 10 mg via INTRAVENOUS

## 2014-02-18 MED ORDER — DIBUCAINE 1 % RE OINT: 1.0000 "application " | TOPICAL_OINTMENT | RECTAL | Status: DC | PRN

## 2014-02-18 MED ORDER — KETAMINE HCL 10 MG/ML IJ SOLN
INTRAMUSCULAR | Status: AC
Start: 2014-02-18 — End: 2014-02-18
  Filled 2014-02-18: qty 1

## 2014-02-18 MED ORDER — IBUPROFEN 600 MG PO TABS: 600.0000 mg | ORAL_TABLET | Freq: Four times a day (QID) | ORAL | Status: DC | PRN

## 2014-02-18 MED ORDER — MIDAZOLAM HCL 2 MG/2ML IJ SOLN
INTRAMUSCULAR | Status: AC
  Filled 2014-02-18: qty 2

## 2014-02-18 MED ORDER — KETOROLAC TROMETHAMINE 30 MG/ML IJ SOLN
30.0000 mg | Freq: Once | INTRAMUSCULAR | Status: AC
  Administered 2014-02-18: 30 mg via INTRAVENOUS
  Filled 2014-02-18: qty 1

## 2014-02-18 MED ORDER — NALOXONE HCL 0.4 MG/ML IJ SOLN: 0.4000 mg | INTRAMUSCULAR | Status: DC | PRN

## 2014-02-18 MED ORDER — ACETAMINOPHEN 325 MG PO TABS: 650.0000 mg | ORAL_TABLET | ORAL | Status: DC | PRN

## 2014-02-18 MED ORDER — OXYTOCIN 40 UNITS IN LACTATED RINGERS INFUSION - SIMPLE MED: 62.5000 mL/h | INTRAVENOUS | Status: AC

## 2014-02-18 MED ORDER — ONDANSETRON HCL 4 MG/2ML IJ SOLN: 4.0000 mg | INTRAMUSCULAR | Status: DC | PRN

## 2014-02-18 MED ORDER — OXYTOCIN 40 UNITS IN LACTATED RINGERS INFUSION - SIMPLE MED
1.0000 m[IU]/min | INTRAVENOUS | Status: DC
  Administered 2014-02-18: 1 m[IU]/min via INTRAVENOUS

## 2014-02-18 SURGICAL SUPPLY — 51 items
APL SKNCLS STERI-STRIP NONHPOA (GAUZE/BANDAGES/DRESSINGS) ×2
BARRIER ADHS 3X4 INTERCEED (GAUZE/BANDAGES/DRESSINGS) ×2 IMPLANT
BENZOIN TINCTURE PRP APPL 2/3 (GAUZE/BANDAGES/DRESSINGS) ×5 IMPLANT
BRR ADH 4X3 ABS CNTRL BYND (GAUZE/BANDAGES/DRESSINGS) ×1
CLAMP CORD UMBIL (MISCELLANEOUS) IMPLANT
CLOSURE WOUND 1/2 X4 (GAUZE/BANDAGES/DRESSINGS) ×2
CLOTH BEACON ORANGE TIMEOUT ST (SAFETY) ×3 IMPLANT
CONTAINER PREFILL 10% NBF 15ML (MISCELLANEOUS) IMPLANT
DRAPE LG THREE QUARTER DISP (DRAPES) IMPLANT
DRSG OPSITE POSTOP 4X10 (GAUZE/BANDAGES/DRESSINGS) ×5 IMPLANT
DURAPREP 26ML APPLICATOR (WOUND CARE) ×3 IMPLANT
ELECT REM PT RETURN 9FT ADLT (ELECTROSURGICAL) ×3
ELECTRODE REM PT RTRN 9FT ADLT (ELECTROSURGICAL) ×1 IMPLANT
EXTRACTOR VACUUM M CUP 4 TUBE (SUCTIONS) IMPLANT
EXTRACTOR VACUUM M CUP 4' TUBE (SUCTIONS)
GLOVE BIOGEL M 6.5 STRL (GLOVE) ×6 IMPLANT
GLOVE BIOGEL PI IND STRL 6.5 (GLOVE) ×1 IMPLANT
GLOVE BIOGEL PI INDICATOR 6.5 (GLOVE) ×2
GOWN STRL REUS W/TWL LRG LVL3 (GOWN DISPOSABLE) ×9 IMPLANT
KIT ABG SYR 3ML LUER SLIP (SYRINGE) IMPLANT
NDL HYPO 25X5/8 SAFETYGLIDE (NEEDLE) IMPLANT
NEEDLE HYPO 25X5/8 SAFETYGLIDE (NEEDLE) IMPLANT
NS IRRIG 1000ML POUR BTL (IV SOLUTION) ×3 IMPLANT
PACK C SECTION WH (CUSTOM PROCEDURE TRAY) ×3 IMPLANT
PAD ABD 8X10 STRL (GAUZE/BANDAGES/DRESSINGS) ×2 IMPLANT
PAD ABD 8X7 1/2 STERILE (GAUZE/BANDAGES/DRESSINGS) ×2 IMPLANT
PAD OB MATERNITY 4.3X12.25 (PERSONAL CARE ITEMS) ×3 IMPLANT
RETRACTOR WND ALEXIS 25 LRG (MISCELLANEOUS) IMPLANT
RTRCTR C-SECT PINK 25CM LRG (MISCELLANEOUS) IMPLANT
RTRCTR C-SECT PINK 34CM XLRG (MISCELLANEOUS) IMPLANT
RTRCTR WOUND ALEXIS 25CM LRG (MISCELLANEOUS) ×6
SPONGE GAUZE 4X4 12PLY (GAUZE/BANDAGES/DRESSINGS) ×2 IMPLANT
STAPLER VISISTAT 35W (STAPLE) IMPLANT
STRIP CLOSURE SKIN 1/2X4 (GAUZE/BANDAGES/DRESSINGS) ×3 IMPLANT
SUT PDS AB 0 CT1 27 (SUTURE) ×6 IMPLANT
SUT PLAIN 0 NONE (SUTURE) IMPLANT
SUT PLAIN 2 0 (SUTURE) ×3
SUT PLAIN 2 0 XLH (SUTURE) ×2 IMPLANT
SUT PLAIN ABS 2-0 CT1 27XMFL (SUTURE) IMPLANT
SUT VIC AB 0 CTX 36 (SUTURE) ×12
SUT VIC AB 0 CTX36XBRD ANBCTRL (SUTURE) ×3 IMPLANT
SUT VIC AB 2-0 CT1 27 (SUTURE) ×3
SUT VIC AB 2-0 CT1 TAPERPNT 27 (SUTURE) ×1 IMPLANT
SUT VIC AB 2-0 SH 27 (SUTURE) ×3
SUT VIC AB 2-0 SH 27XBRD (SUTURE) IMPLANT
SUT VIC AB 3-0 SH 27 (SUTURE)
SUT VIC AB 3-0 SH 27X BRD (SUTURE) IMPLANT
SUT VIC AB 4-0 KS 27 (SUTURE) ×5 IMPLANT
TOWEL OR 17X24 6PK STRL BLUE (TOWEL DISPOSABLE) ×3 IMPLANT
TRAY FOLEY CATH 14FR (SET/KITS/TRAYS/PACK) ×3 IMPLANT
WATER STERILE IRR 1000ML POUR (IV SOLUTION) ×3 IMPLANT

## 2014-02-18 NOTE — Anesthesia Postprocedure Evaluation (Signed)
  Anesthesia Post-op Note  Patient: Katrina BarryLaura L Falwell  Procedure(s) Performed: Procedure(s): CESAREAN SECTION (N/A)  Patient is awake, responsive, moving her legs, and has signs of resolution of her numbness. Pain and nausea are reasonably well controlled. Vital signs are stable and clinically acceptable. Oxygen saturation is clinically acceptable. There are no apparent anesthetic complications at this time. Patient is ready for discharge.

## 2014-02-18 NOTE — Progress Notes (Signed)
  Subjective: Still having pain in back.  Feeling urge to push.  Objective: BP 131/67  Pulse 77  Temp(Src) 98.8 F (37.1 C) (Oral)  Resp 18  Ht 5\' 6"  (1.676 m)  Wt 295 lb (133.811 kg)  BMI 47.64 kg/m2   Total I/O In: -  Out: 850 [Urine:850]  FHT: Category 2--variable decels with contractions UC:   regular, every 3 minutes SVE:   Dilation: Lip/rim Effacement (%): 90 Station: -2 Exam by:: Katrina ArchV Grimes CNM Prominent right ischial spine. MVUs 240 Pitocin on 4 mu/min Attempted 20 min of pushing--no descent.  Assessment:  Slow progress in multigravida, previous C/S  Plan: Rest at present, allow passive descent. Close observation of FHR.  Nigel BridgemanLATHAM, VICKI CNM 02/18/2014, 1200

## 2014-02-18 NOTE — Anesthesia Procedure Notes (Signed)
Epidural Patient location during procedure: OB Start time: 02/18/2014 4:27 AM End time: 02/18/2014 4:41 AM  Staffing Anesthesiologist: MOSER, CHRIS Performed by: anesthesiologist   Preanesthetic Checklist Completed: patient identified, surgical consent, pre-op evaluation, timeout performed, IV checked, risks and benefits discussed and monitors and equipment checked  Epidural Patient position: sitting Prep: site prepped and draped and DuraPrep Patient monitoring: heart rate, cardiac monitor, continuous pulse ox and blood pressure Approach: midline Location: L4-L5 Injection technique: LOR saline  Needle:  Needle type: Tuohy  Needle gauge: 17 G Needle length: 9 cm Needle insertion depth: 7 cm Catheter type: closed end flexible Catheter size: 19 Gauge Catheter at skin depth: 14 cm Test dose: Other  Assessment Events: blood not aspirated, injection not painful, no injection resistance, negative IV test and no paresthesia  Additional Notes H+P and labs checked, risks and benefits discussed with the patient, consent obtained, procedure tolerated well and without complications.  Reason for block:procedure for pain

## 2014-02-18 NOTE — Op Note (Signed)
Cesarean Section Procedure Note  Indications: non-reassuring fetal status  Pre-operative Diagnosis: 39 week 2 day pregnancy.  Post-operative Diagnosis: same  Surgeon: Jessee AversOLE,TARA J.   Assistants: Nigel BridgemanVicki Latham CNM  Anesthesia: Epidural anesthesia  ASA Class: 2   Indication 39 wks and 2 days in labor with repetitive fetal late decelerations.   Procedure Details   The patient was seen in the Holding Room. The risks, benefits, complications, treatment options, and expected outcomes were discussed with the patient.  The patient concurred with the proposed plan, giving informed consent.  The site of surgery properly noted/marked. The patient was taken to Operating Room # 9, identified as Idalia NeedleLaura L Boodram and the procedure verified as C-Section Delivery. A Time Out was held and the above information confirmed.  After induction of anesthesia, the patient was draped and prepped in the usual sterile manner. A Pfannenstiel incision was made and carried down through the subcutaneous tissue to the fascia. Fascial incision was made and extended transversely. The fascia was separated from the underlying rectus tissue superiorly and inferiorly. The peritoneum was identified and entered. Peritoneal incision was extended longitudinally. The utero-vesical peritoneal reflection was incised transversely and the bladder flap was bluntly freed from the lower uterine segment. A low transverse uterine incision was made. Delivered from cephalic presentation was a  Female with Apgar scores of 8 at one minute and 9 at five minutes. After the umbilical cord was clamped and cut cord blood was obtained for evaluation. The placenta was removed intact and appeared normal. The uterine outline, tubes and ovaries appeared normal. The uterine incision was closed with running locked sutures of 0 vicryl . A second layer of 0 vicryl was used to imbricate the incision.  Hemostasis was observed. Lavage was carried out until clear.   interseed was placed along the fascial incision . The fascia was then reapproximated with running sutures of 0 pds .  The subcutaneous layer was reapproximated with 2-0 vicryl. The skin was reapproximated with 4-0 vicryl .  Instrument, sponge, and needle counts were correct prior the abdominal closure and at the conclusion of the case.   Findings: Female infant in the cephalic presentation.. Nuchal cord x 1.. Normal fallopian tubes and ovaries.   Estimated Blood Loss:  800 mL         Drains: Foley          Total IV Fluids:  Per anesthesia ml         Specimens: Placenta and to labor and delivery            Implants: none         Complications:  None; patient tolerated the procedure well.         Disposition: PACU - hemodynamically stable.         Condition: stable  Attending Attestation: I performed the procedure.

## 2014-02-18 NOTE — Progress Notes (Signed)
  Subjective: Received Stadol for back pain.  Objective: BP 131/67  Pulse 77  Temp(Src) 98.8 F (37.1 C) (Oral)  Resp 18  Ht 5\' 6"  (1.676 m)  Wt 295 lb (133.811 kg)  BMI 47.64 kg/m2   Total I/O In: -  Out: 850 [Urine:850]  FHT: Category 2--series of late decels UC:   regular, every 2 minutes SVE:   Dilation: Lip/rim Effacement (%): 90 Station: -2 Exam by:: V Randal Yepiz CNM Pitocin off now. MVUs adequate x 2 hours with no descent  Assessment:  Arrest of active labor LGA infant Hx previous C/S, with prior and subsequent VBACs  Plan: Recommend C/S for above reasons Consulted with Dr. Gillis Endsole--she is agreeable with plan. R&B of C/S reviewed with patient and family, including bleeding, infection, and damage to other organs. Patient and family seem to understand these risks and wish to proceed.  Nigel BridgemanLATHAM, Eric Nees CNM 02/18/2014, 12:36 PM

## 2014-02-18 NOTE — Progress Notes (Signed)
  Subjective: Still having pain in right lower back--sporadic and sharp, not associated with contractions.  Still aware of contractions despite epidural.  Hx of scoliosis--anesthesia feels that may be the issue.  Objective: BP 164/88  Pulse 73  Temp(Src) 98.5 F (36.9 C) (Oral)  Resp 18  Ht 5\' 6"  (1.676 m)  Wt 295 lb (133.811 kg)  BMI 47.64 kg/m2      FHT: Category 1 UC:   regular, every 4 minutes SVE:   Dilation: 6.5 Effacement (%): 90 Station: -3 Exam by:: V Latham CNM Vtx to official bedside US, but vtx still high. Descends to -2 and well-applied with UC, then back to -3 after UC. Forewaters noted--AROM, clear fluid IUPC and FSE applied.  FSE not tracing. External cardio reapplied with adequate tracing.  Assessment:  Slowly progressive labor LGA fetus Poorly functioning epidural, likely due to scoliosis  Plan: Stadol 1 mg IV now Augment if MVUs inadequate Dr. Richardson Doppole updated.  Nigel BridgemanLATHAM, VICKI CNM 02/18/2014, 8:46 AM

## 2014-02-18 NOTE — Transfer of Care (Signed)
Immediate Anesthesia Transfer of Care Note  Patient: Katrina NeedleLaura L Viglione  Procedure(s) Performed: Procedure(s): CESAREAN SECTION (N/A)  Patient Location: PACU  Anesthesia Type:Epidural  Level of Consciousness: awake and alert   Airway & Oxygen Therapy: Patient Spontanous Breathing  Post-op Assessment: Report given to PACU RN and Post -op Vital signs reviewed and stable  Post vital signs: Reviewed and stable  Complications: No apparent anesthesia complications

## 2014-02-18 NOTE — Progress Notes (Signed)
  Subjective: Feeling some pressure.  Pain in back better s/p Stadol IV.  Objective: BP 135/86  Pulse 71  Temp(Src) 98.8 F (37.1 C) (Oral)  Resp 18  Ht 5\' 6"  (1.676 m)  Wt 295 lb (133.811 kg)  BMI 47.64 kg/m2      FHT: Category 1 UC:   regular, every 2-3 minutes SVE:   Dilation: 8 Effacement (%): 90 Station: -2 Exam by:: V Ronnie Mallette CNM MVUs 170 last 20 min assessment Pitocin on 4 mu/min  Assessment:  Progressive labor LGA fetus  Plan: Will continue to augment to establish/maintain adequacy. Dr. Richardson Doppole updated--she plans to be present at delivery.   Nigel BridgemanLATHAM, Dayelin Balducci CNM 02/18/2014, 10:54 AM

## 2014-02-18 NOTE — Progress Notes (Signed)
  Subjective: Pt reports increased intensity of UCs.  Pt requesting epidural at this time, discussed possibility of starting pitocin after epidural.  Family at bedside and supportive.  Objective: BP 154/72  Pulse 79  Temp(Src) 98 F (36.7 C) (Oral)  Resp 18  Ht 5\' 6"  (1.676 m)  Wt 295 lb (133.811 kg)  BMI 47.64 kg/m2      FHT: Category I UC:   regular, every 3-4 minutes  SVE:   Dilation: 3 Effacement (%): 70 Station: -2 Exam by:: J. Ted Goodner, CNM   Spontaneous labor with SROM, will continue to assess progress, may start pitocin  Labor: No cervical change since ROM  Preeclampsia: 136/80, 129/77, 154/72  Fetal Wellbeing: Category I   Pain Control: Epidural  I/D: GBS pos; Vancomycin therapy; SROM at 2300; Afebrile  Anticipated MOD: NSVD    [name] 12/02/2013, 7:23 AM

## 2014-02-18 NOTE — Progress Notes (Signed)
[name] is a 28 y.o. [id number] at 5325w2d    Subjective: In to see patient due to report from CNM vicki latham of repetitive late decelerations.  Pt only partially comfortable with her epidural .   Objective: BP 131/67  Pulse 77  Temp(Src) 98.8 F (37.1 C) (Oral)  Resp 18  Ht 5\' 6"  (1.676 m)  Wt 133.811 kg (295 lb)  BMI 47.64 kg/m2   Total I/O In: -  Out: 850 [Urine:850]  FHT:  FHR: 120 bpm, variability: minimal ,  accelerations:  Abscent,  decelerations:  Present late UC:   regular, every 2-3 minutes SVE:   Dilation: Lip/rim Effacement (%): 90 Station: -2 Exam by:: Manfred ArchV Latham CNM  Labs: Lab Results  Component Value Date   WBC 11.1* 02/18/2014   HGB 12.0 02/18/2014   HCT 35.1* 02/18/2014   MCV 87.5 02/18/2014   PLT 161 02/18/2014    Assessment / Plan:  nonreassuring fetal heart rate.. recommend cesarean section.. r/b/a of cesarean section discussed with the patient including but not limited to infection / bleeding damage to bowel bladder baby with the need for further surgery .. r/o transfusion HIV/ Hep B&C discussed. pt voiced understanding and desires to proceed.    Fetal Wellbeing:  Category II  COLE,TARA J. 02/18/2014, 12:57 PM

## 2014-02-18 NOTE — Progress Notes (Signed)
  Subjective: Pt is comfortable with epidural.  Family at bedside and supportive.  Objective: BP 130/65  Pulse 78  Temp(Src) 98.1 F (36.7 C) (Oral)  Resp 20  Ht 5\' 6"  (1.676 m)  Wt 295 lb (133.811 kg)  BMI 47.64 kg/m2      FHT: Category I UC:   regular, every 3-5 minutes  SVE:   Dilation: 4 Effacement (%): 90 Station: -2 Exam by:: J. Larri Yehle, CNM Unsure of presentation - will order BS u/s   Spontaneous labor, progressing normally  Labor: Progressing normally  Preeclampsia: BPs elevated but stable; PIH labs in process  Fetal Wellbeing: Category I  Pain Control: Epidural  I/D: GBS pos; Vancomycin therapy; SROM at 2300; Afebrile  Anticipated MOD: NSVD    Caleb Prigmore 12/02/2013, 7:23 AM

## 2014-02-18 NOTE — Progress Notes (Signed)
CNM to order bedside US to be 100% sure of presentation.

## 2014-02-18 NOTE — Anesthesia Preprocedure Evaluation (Signed)
Anesthesia Evaluation  Patient identified by MRN, date of birth, ID band Patient awake    Reviewed: Allergy & Precautions, H&P , NPO status , Patient's Chart, lab work & pertinent test results  History of Anesthesia Complications (+) history of anesthetic complications  Airway Mallampati: III TM Distance: >3 FB Neck ROM: Full    Dental  (+) Teeth Intact   Pulmonary neg sleep apnea, neg COPDneg recent URI, former smoker,          Cardiovascular hypertension, Pt. on medications and Pt. on home beta blockers Rhythm:Regular     Neuro/Psych  Headaches, PSYCHIATRIC DISORDERS Anxiety Depression    GI/Hepatic negative GI ROS, Neg liver ROS,   Endo/Other  neg diabetesMorbid obesity  Renal/GU negative Renal ROS     Musculoskeletal   Abdominal   Peds  Hematology  (+) anemia ,   Anesthesia Other Findings   Reproductive/Obstetrics (+) Pregnancy                           Anesthesia Physical Anesthesia Plan  ASA: II  Anesthesia Plan: Epidural   Post-op Pain Management:    Induction:   Airway Management Planned: Natural Airway  Additional Equipment: None  Intra-op Plan:   Post-operative Plan:   Informed Consent: I have reviewed the patients History and Physical, chart, labs and discussed the procedure including the risks, benefits and alternatives for the proposed anesthesia with the patient or authorized representative who has indicated his/her understanding and acceptance.   Dental advisory given  Plan Discussed with: Anesthesiologist  Anesthesia Plan Comments:         Anesthesia Quick Evaluation

## 2014-02-19 ENCOUNTER — Encounter (HOSPITAL_COMMUNITY): Payer: Self-pay | Admitting: Obstetrics and Gynecology

## 2014-02-19 LAB — CBC
HEMATOCRIT: 29.4 % — AB (ref 36.0–46.0)
Hemoglobin: 9.9 g/dL — ABNORMAL LOW (ref 12.0–15.0)
MCH: 29.7 pg (ref 26.0–34.0)
MCHC: 33.7 g/dL (ref 30.0–36.0)
MCV: 88.3 fL (ref 78.0–100.0)
PLATELETS: 127 10*3/uL — AB (ref 150–400)
RBC: 3.33 MIL/uL — AB (ref 3.87–5.11)
RDW: 15 % (ref 11.5–15.5)
WBC: 12.5 10*3/uL — AB (ref 4.0–10.5)

## 2014-02-19 MED ORDER — BUTORPHANOL TARTRATE 1 MG/ML IJ SOLN: 1.0000 mg | INTRAMUSCULAR | Status: DC | PRN

## 2014-02-19 NOTE — Anesthesia Postprocedure Evaluation (Signed)
Anesthesia Post Note  Patient: [name]  Procedure(s) Performed: Procedure(s) (LRB): CESAREAN SECTION (N/A)  Anesthesia type: Epidural  Patient location: Mother/Baby  Post pain: Pain level controlled  Post assessment: Post-op Vital signs reviewed  Last Vitals:  Filed Vitals:   02/19/14 0535  BP: 98/63  Pulse: 74  Temp: 36.7 C  Resp: 18    Post vital signs: Reviewed  Level of consciousness:alert  Complications: No apparent anesthesia complications

## 2014-02-19 NOTE — Progress Notes (Signed)
Ur chart review completed.  

## 2014-02-19 NOTE — Addendum Note (Signed)
Addendum created 02/19/14 0802 by Algis GreenhouseLinda A Akia Montalban, CRNA   Modules edited: Notes Section   Notes Section:  File: [id number]

## 2014-02-19 NOTE — Progress Notes (Signed)
[name]  Subjective: Postpartum Day 1: Failed VBAC for Repeat Cesarean Delivery due to NRFHT  Patient up ad lib, reports no syncope or dizziness.  Reports consuming regular diet without issues.  Denies N/V, BM, and flatus.  Reports pain well controlled with percocet and ibuprofen.  Patient is bottlefeeding, reports infant doing well.  Unsure of contraception.  Objective: Vital signs in last 24 hours: Temp:  [97.7 F (36.5 C)-98.6 F (37 C)] 98.1 F (36.7 C) (06/22 0535) Pulse Rate:  [71-88] 74 (06/22 0902) Resp:  [18-20] 18 (06/22 0535) BP: (94-138)/(49-86) 111/70 mmHg (06/22 0902) SpO2:  [94 %-100 %] 95 % (06/22 0135)  Physical Exam:  General: alert, cooperative, no distress and mildly obese Lochia: appropriate Uterine Fundus: firm, U/-1 Abdomen:  + bowel sounds on right side, left side hypoactive, Distended, appropriately tender Incision: Pressure dressing CDI DVT Evaluation: No evidence of DVT seen on physical exam. Negative Homan's sign. Calf/Ankle edema is present. JP drain: None   Assessment/Plan: S/P Repeat C/S Day 1 Stable Bottlefeeding Asymptomatic Anemia  -Continue Fe+ supplementation 325mg  BID -Patient unsure of inpatient vs outpatient circ-will update -Continue other mgmt as ordered  EMLY, JESSICA LYNN CNM, MSN 02/19/2014, 10:03 AM

## 2014-02-20 MED ORDER — OXYCODONE-ACETAMINOPHEN 5-325 MG PO TABS: 1.0000 | ORAL_TABLET | ORAL | Status: DC | PRN

## 2014-02-20 MED ORDER — FERROUS SULFATE 325 (65 FE) MG PO TABS: 325.0000 mg | ORAL_TABLET | Freq: Two times a day (BID) | ORAL | Status: DC

## 2014-02-20 MED ORDER — IBUPROFEN 600 MG PO TABS: 600.0000 mg | ORAL_TABLET | Freq: Four times a day (QID) | ORAL | Status: DC

## 2014-02-20 MED ORDER — DOCUSATE SODIUM 100 MG PO CAPS: 100.0000 mg | ORAL_CAPSULE | Freq: Two times a day (BID) | ORAL | Status: DC

## 2014-02-20 NOTE — Discharge Instructions (Signed)
Iron-Rich Diet °An iron-rich diet contains foods that are good sources of iron. Iron is an important mineral that helps your body produce hemoglobin. Hemoglobin is a protein in red blood cells that carries oxygen to the body's tissues. Sometimes, the iron level in your blood can be low. This may be caused by: °· A lack of iron in your diet. °· Blood loss. °· Times of growth, such as during pregnancy or during a child's growth and development. °Low levels of iron can cause a decrease in the number of red blood cells. This can result in iron deficiency anemia. Iron deficiency anemia symptoms include: °· Tiredness. °· Weakness. °· Irritability. °· Increased chance of infection. °Here are some recommendations for daily iron intake: °· Males older than 29 years of age need 8 mg of iron per day. °· Women ages 19 to 50 need 18 mg of iron per day. °· Pregnant women need 27 mg of iron per day, and women who are over 19 years of age and breastfeeding need 9 mg of iron per day. °· Women over the age of 50 need 8 mg of iron per day. °SOURCES OF IRON °There are 2 types of iron that are found in food: heme iron and nonheme iron. Heme iron is absorbed by the body better than nonheme iron. Heme iron is found in meat, poultry, and fish. Nonheme iron is found in grains, beans, and vegetables. °Heme Iron Sources °Food / Iron (mg) °· Chicken liver, 3 oz (85 g)/ 10 mg °· Beef liver, 3 oz (85 g)/ 5.5 mg °· Oysters, 3 oz (85 g)/ 8 mg °· Beef, 3 oz (85 g)/ 2 to 3 mg °· Shrimp, 3 oz (85 g)/ 2.8 mg °· Turkey, 3 oz (85 g)/ 2 mg °· Chicken, 3 oz (85 g) / 1 mg °· Fish (tuna, halibut), 3 oz (85 g)/ 1 mg °· Pork, 3 oz (85 g)/ 0.9 mg °Nonheme Iron Sources °Food / Iron (mg) °· Ready-to-eat breakfast cereal, iron-fortified / 3.9 to 7 mg °· Tofu, ½ cup / 3.4 mg °· Kidney beans, ½ cup / 2.6 mg °· Baked potato with skin / 2.7 mg °· Asparagus, ½ cup / 2.2 mg °· Avocado / 2 mg °· Dried peaches, ½ cup / 1.6 mg °· Raisins, ½ cup / 1.5 mg °· Soy milk, 1 cup  / 1.5 mg °· Whole-wheat bread, 1 slice / 1.2 mg °· Spinach, 1 cup / 0.8 mg °· Broccoli, ½ cup / 0.6 mg °IRON ABSORPTION °Certain foods can decrease the body's absorption of iron. Try to avoid these foods and beverages while eating meals with iron-containing foods: °· Coffee. °· Tea. °· Fiber. °· Soy. °Foods containing vitamin C can help increase the amount of iron your body absorbs from iron sources, especially from nonheme sources. Eat foods with vitamin C along with iron-containing foods to increase your iron absorption. Foods that are high in vitamin C include many fruits and vegetables. Some good sources are: °· Fresh orange juice. °· Oranges. °· Strawberries. °· Mangoes. °· Grapefruit. °· Red bell peppers. °· Green bell peppers. °· Broccoli. °· Potatoes with skin. °· Tomato juice. °Document Released: 03/31/2005 Document Revised: 11/09/2011 Document Reviewed: 02/05/2011 °ExitCare® Patient Information ©2015 [employer]. This information is not intended to replace advice given to you by your health care provider. Make sure you discuss any questions you have with your health care provider. °Postpartum Depression and Baby Blues °The postpartum period begins right after the birth of a baby.   During this time, there is often a great amount of joy and excitement. It is also a time of many changes in the life of the parents. Regardless of how many times a mother gives birth, each child brings new challenges and dynamics to the family. It is not unusual to have feelings of excitement along with confusing shifts in moods, emotions, and thoughts. All mothers are at risk of developing postpartum depression or the "baby blues." These mood changes can occur right after giving birth, or they may occur many months after giving birth. The baby blues or postpartum depression can be mild or severe. Additionally, postpartum depression can go away rather quickly, or it can be a long-term condition.  °CAUSES °Raised hormone levels  and the rapid drop in those levels are thought to be a main cause of postpartum depression and the baby blues. A number of hormones change during and after pregnancy. Estrogen and progesterone usually decrease right after the delivery of your baby. The levels of thyroid hormone and various cortisol steroids also rapidly drop. Other factors that play a role in these mood changes include major life events and genetics.  °RISK FACTORS °If you have any of the following risks for the baby blues or postpartum depression, know what symptoms to watch out for during the postpartum period. Risk factors that may increase the likelihood of getting the baby blues or postpartum depression include: °· Having a personal or family history of depression.   °· Having depression while being pregnant.   °· Having premenstrual mood issues or mood issues related to oral contraceptives. °· Having a lot of life stress.   °· Having marital conflict.   °· Lacking a social support network.   °· Having a baby with special needs.   °· Having health problems, such as diabetes.   °SIGNS AND SYMPTOMS °Symptoms of baby blues include: °· Brief changes in mood, such as going from extreme happiness to sadness. °· Decreased concentration.   °· Difficulty sleeping.   °· Crying spells, tearfulness.   °· Irritability.   °· Anxiety.   °Symptoms of postpartum depression typically begin within the first month after giving birth. These symptoms include: °· Difficulty sleeping or excessive sleepiness.   °· Marked weight loss.   °· Agitation.   °· Feelings of worthlessness.   °· Lack of interest in activity or food.   °Postpartum psychosis is a very serious condition and can be dangerous. Fortunately, it is rare. Displaying any of the following symptoms is cause for immediate medical attention. Symptoms of postpartum psychosis include:  °· Hallucinations and delusions.   °· Bizarre or disorganized behavior.   °· Confusion or disorientation.   °DIAGNOSIS  °A  diagnosis is made by an evaluation of your symptoms. There are no medical or lab tests that lead to a diagnosis, but there are various questionnaires that a health care provider may use to identify those with the baby blues, postpartum depression, or psychosis. Often, a screening tool called the Edinburgh Postnatal Depression Scale is used to diagnose depression in the postpartum period.  °TREATMENT °The baby blues usually goes away on its own in 1-2 weeks. Social support is often all that is needed. You will be encouraged to get adequate sleep and rest. Occasionally, you may be given medicines to help you sleep.  °Postpartum depression requires treatment because it can last several months or longer if it is not treated. Treatment may include individual or group therapy, medicine, or both to address any social, physiological, and psychological factors that may play a role in the depression. Regular exercise, a healthy diet, rest,   and social support may also be strongly recommended.  Postpartum psychosis is more serious and needs treatment right away. Hospitalization is often needed. HOME CARE INSTRUCTIONS  Get as much rest as you can. Nap when the baby sleeps.   Exercise regularly. Some women find yoga and walking to be beneficial.   Eat a balanced and nourishing diet.   Do little things that you enjoy. Have a cup of tea, take a bubble bath, read your favorite magazine, or listen to your favorite music.  Avoid alcohol.   Ask for help with household chores, cooking, grocery shopping, or running errands as needed. Do not try to do everything.   Talk to people close to you about how you are feeling. Get support from your partner, family members, friends, or other new moms.  Try to stay positive in how you think. Think about the things you are grateful for.   Do not spend a lot of time alone.   Only take over-the-counter or prescription medicine as directed by your health care  provider.  Keep all your postpartum appointments.   Let your health care provider know if you have any concerns.  SEEK MEDICAL CARE IF: You are having a reaction to or problems with your medicine. SEEK IMMEDIATE MEDICAL CARE IF:  You have suicidal feelings.   You think you may harm the baby or someone else. MAKE SURE YOU:  Understand these instructions.  Will watch your condition.  Will get help right away if you are not doing well or get worse. Document Released: 05/21/2004 Document Revised: 08/22/2013 Document Reviewed: 05/29/2013 [facility] Patient Information 2015 [place], [place]. This information is not intended to replace advice given to you by your health care provider. Make sure you discuss any questions you have with your health care provider. Postpartum Care After Cesarean Delivery After you deliver your newborn (postpartum period), the usual stay in the hospital is 24-72 hours. If there were problems with your labor or delivery, or if you have other medical problems, you might be in the hospital longer.  While you are in the hospital, you will receive help and instructions on how to care for yourself and your newborn during the postpartum period.  While you are in the hospital:  It is normal for you to have pain or discomfort from the incision in your abdomen. Be sure to tell your nurses when you are having pain, where the pain is located, and what makes the pain worse.  If you are breastfeeding, you may feel uncomfortable contractions of your uterus for a couple of weeks. This is normal. The contractions help your uterus get back to normal size.  It is normal to have some bleeding after delivery.  For the first 1-3 days after delivery, the flow is red and the amount may be similar to a period.  It is common for the flow to start and stop.  In the first few days, you may pass some small clots. Let your nurses know if you begin to pass large clots or your flow  increases.  Do not  flush blood clots down the toilet before having the nurse look at them.  During the next 3-10 days after delivery, your flow should become more watery and pink or brown-tinged in color.  Ten to fourteen days after delivery, your flow should be a small amount of yellowish-white discharge.  The amount of your flow will decrease over the first few weeks after delivery. Your flow may stop in 6-8  weeks. Most women have had their flow stop by 12 weeks after delivery.  You should change your sanitary pads frequently.  Wash your hands thoroughly with soap and water for at least 20 seconds after changing pads, using the toilet, or before holding or feeding your newborn.  Your intravenous (IV) tubing will be removed when you are drinking enough fluids.  The urine drainage tube (urinary catheter) that was inserted before delivery may be removed within 6-8 hours after delivery or when feeling returns to your legs. You should feel like you need to empty your bladder within the first 6-8 hours after the catheter has been removed.  In case you become weak, lightheaded, or faint, call your nurse before you get out of bed for the first time and before you take a shower for the first time.  Within the first few days after delivery, your breasts may begin to feel tender and full. This is called engorgement. Breast tenderness usually goes away within 48-72 hours after engorgement occurs. You may also notice milk leaking from your breasts. If you are not breastfeeding, do not stimulate your breasts. Breast stimulation can make your breasts produce more milk.  Spending as much time as possible with your newborn is very important. During this time, you and your newborn can feel close and get to know each other. Having your newborn stay in your room (rooming in) will help to strengthen the bond with your newborn. It will give you time to get to know your newborn and become comfortable caring for  your newborn.  Your hormones change after delivery. Sometimes the hormone changes can temporarily cause you to feel sad or tearful. These feelings should not last more than a few days. If these feelings last longer than that, you should talk to your caregiver.  If desired, talk to your caregiver about methods of family planning or contraception.  Talk to your caregiver about immunizations. Your caregiver may want you to have the following immunizations before leaving the hospital:  Tetanus, diphtheria, and pertussis (Tdap) or tetanus and diphtheria (Td) immunization. It is very important that you and your family (including grandparents) or others caring for your newborn are up-to-date with the Tdap or Td immunizations. The Tdap or Td immunization can help protect your newborn from getting ill.  Rubella immunization.  Varicella (chickenpox) immunization.  Influenza immunization. You should receive this annual immunization if you did not receive the immunization during your pregnancy. Document Released: 05/11/2012 Document Reviewed: 05/11/2012 [facility] Patient Information 2015 [place], [place]. This information is not intended to replace advice given to you by your health care provider. Make sure you discuss any questions you have with your health care provider.

## 2014-02-20 NOTE — Discharge Summary (Signed)
[name]  Obstetric Discharge Summary Reason for Admission: induction of labor Prenatal Procedures: GHTN Intrapartum Procedures: cesarean: low cervical, transverse and GBS prophylaxis Postpartum Procedures: none Complications-Operative and Postpartum: none Hemoglobin  Date Value Ref Range Status  02/19/2014 9.9* 12.0 - 15.0 g/dL Final     DELTA CHECK NOTED     REPEATED TO VERIFY     HCT  Date Value Ref Range Status  02/19/2014 29.4* 36.0 - 46.0 % Final    Physical Exam:  General: alert, cooperative and no distress Lochia: appropriate Uterine Fundus: firm Incision: healing well, no significant drainage.  Removed steri strips and replaced honey comb dsg DVT Evaluation: No evidence of DVT seen on physical exam.  Discharge Diagnoses: Term Pregnancy-delivered and Failed induction  Discharge Information: Date: 02/20/2014 Activity: pelvic rest Diet: routine Medications: Ibuprofen, Colace, Iron and Percocet Condition: stable Instructions: refer to practice specific booklet Discharge to: home Follow-up Information   [facility]. (pt will be call for BP check in 1 week)    Specialty:  Obstetrics and Gynecology   Contact information:   [address] [place] [id number] [phone]      Newborn Data: Live born female, Enid Derrythan Birth Weight: 10 lb 6 oz (4705 g) APGAR: 8, 9  Home with mother.  Standard, Venus 02/20/2014, 10:53 AM

## 2014-02-21 ENCOUNTER — Ambulatory Visit: Payer: Medicaid Other | Admitting: Physical Therapy

## 2014-02-21 LAB — TYPE AND SCREEN
ABO/RH(D): A POS
ANTIBODY SCREEN: NEGATIVE
UNIT DIVISION: 0
UNIT DIVISION: 0
UNIT DIVISION: 0
UNIT DIVISION: 0
Unit division: 0
Unit division: 0

## 2014-02-28 ENCOUNTER — Encounter: Payer: Self-pay | Admitting: *Deleted

## 2014-07-02 ENCOUNTER — Encounter (HOSPITAL_COMMUNITY): Payer: Self-pay | Admitting: Obstetrics and Gynecology

## 2014-08-17 ENCOUNTER — Emergency Department (INDEPENDENT_AMBULATORY_CARE_PROVIDER_SITE_OTHER)
Admission: EM | Admit: 2014-08-17 | Discharge: 2014-08-17 | Disposition: A | Discharge status: Home / Self Care | Payer: Medicaid Other | Source: Home / Self Care | Attending: Family Medicine | Admitting: Family Medicine

## 2014-08-17 ENCOUNTER — Encounter (HOSPITAL_COMMUNITY): Payer: Self-pay | Admitting: Emergency Medicine

## 2014-08-17 DIAGNOSIS — G44209 Tension-type headache, unspecified, not intractable: Secondary | ICD-10-CM

## 2014-08-17 DIAGNOSIS — B001 Herpesviral vesicular dermatitis: Secondary | ICD-10-CM | POA: Diagnosis not present

## 2014-08-17 MED ORDER — METOCLOPRAMIDE HCL 5 MG/ML IJ SOLN
5.0000 mg | Freq: Once | INTRAMUSCULAR | Status: AC
  Administered 2014-08-17: 5 mg via INTRAMUSCULAR

## 2014-08-17 MED ORDER — METOCLOPRAMIDE HCL 5 MG/ML IJ SOLN
INTRAMUSCULAR | Status: AC
  Filled 2014-08-17: qty 2

## 2014-08-17 MED ORDER — KETOROLAC TROMETHAMINE 60 MG/2ML IM SOLN
INTRAMUSCULAR | Status: AC
  Filled 2014-08-17: qty 2

## 2014-08-17 MED ORDER — DEXAMETHASONE SODIUM PHOSPHATE 10 MG/ML IJ SOLN
INTRAMUSCULAR | Status: AC
  Filled 2014-08-17: qty 1

## 2014-08-17 MED ORDER — DICLOFENAC SODIUM 50 MG PO TBEC
50.0000 mg | DELAYED_RELEASE_TABLET | Freq: Two times a day (BID) | ORAL | Status: DC | PRN
Start: 2014-08-17 — End: 2014-11-27

## 2014-08-17 MED ORDER — DEXAMETHASONE SODIUM PHOSPHATE 10 MG/ML IJ SOLN
10.0000 mg | Freq: Once | INTRAMUSCULAR | Status: AC
  Administered 2014-08-17: 10 mg via INTRAMUSCULAR

## 2014-08-17 MED ORDER — KETOROLAC TROMETHAMINE 60 MG/2ML IM SOLN
60.0000 mg | Freq: Once | INTRAMUSCULAR | Status: AC
  Administered 2014-08-17: 60 mg via INTRAMUSCULAR

## 2014-08-17 MED ORDER — ACYCLOVIR 400 MG PO TABS: 400.0000 mg | ORAL_TABLET | Freq: Every day | ORAL | Status: DC
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
## 2014-08-17 NOTE — ED Notes (Signed)
C/o HA onset 4 days Sx also include: cold sore on upper lip, blurry vision, BA Denies fevers, chills Taking OTC cold meds w/no relief.  Alert, no signs of acute distress

## 2014-08-17 NOTE — Discharge Instructions (Signed)
Thank you for coming in today. Take diclofenac as needed for headache Take acyclovir 5 times daily for 5 days for cold sore Come back as needed Go to the emergency room if your headache becomes excruciating or you have weakness or numbness or uncontrolled vomiting.    Cold Sore A cold sore (fever blister) is a skin infection caused by the herpes simplex virus (HSV-1). HSV-1 is closely related to the virus that causes genital herpes (HSV-2), but they are not the same even though both viruses can cause oral and genital infections. Cold sores are small, fluid-filled sores inside of the mouth or on the lips, gums, nose, chin, cheeks, or fingers.  The herpes simplex virus can be easily passed (contagious) to other people through close personal contact, such as kissing or sharing personal items. The virus can also spread to other parts of the body, such as the eyes or genitals. Cold sores are contagious until the sores crust over completely. They often heal within 2 weeks.  Once a person is infected, the herpes simplex virus remains permanently in the body. Therefore, there is no cure for cold sores, and they often recur when a person is tired, stressed, sick, or gets too much sun. Additional factors that can cause a recurrence include hormone changes in menstruation or pregnancy, certain drugs, and cold weather.  CAUSES  Cold sores are caused by the herpes simplex virus. The virus is spread from person to person through close contact, such as through kissing, touching the affected area, or sharing personal items such as lip balm, razors, or eating utensils.  SYMPTOMS  The first infection may not cause symptoms. If symptoms develop, the symptoms often go through different stages. Here is how a cold sore develops:   Tingling, itching, or burning is felt 1-2 days before the outbreak.   Fluid-filled blisters appear on the lips, inside the mouth, nose, or on the cheeks.   The blisters start to ooze clear  fluid.   The blisters dry up and a yellow crust appears in its place.   The crust falls off.  Symptoms depend on whether it is the initial outbreak or a recurrence. Some other symptoms with the first outbreak may include:   Fever.   Sore throat.   Headache.   Muscle aches.   Swollen neck glands.  DIAGNOSIS  A diagnosis is often made based on your symptoms and looking at the sores. Sometimes, a sore may be swabbed and then examined in the lab to make a final diagnosis. If the sores are not present, blood tests can find the herpes simplex virus.  TREATMENT  There is no cure for cold sores and no vaccine for the herpes simplex virus. Within 2 weeks, most cold sores go away on their own without treatment. Medicines cannot make the infection go away, but medicine can help relieve some of the pain associated with the sores, can work to stop the virus from multiplying, and can also shorten healing time. Medicine may be in the form of creams, gels, pills, or a shot.  HOME CARE INSTRUCTIONS   Only take over-the-counter or prescription medicines for pain, discomfort, or fever as directed by your caregiver. Do not use aspirin.   Use a cotton-tip swab to apply creams or gels to your sores.   Do not touch the sores or pick the scabs. Wash your hands often. Do not touch your eyes without washing your hands first.   Avoid kissing, oral sex, and sharing personal  items until sores heal.   Apply an ice pack on your sores for 10-15 minutes to ease any discomfort.   Avoid hot, cold, or salty foods because they may hurt your mouth. Eat a soft, bland diet to avoid irritating the sores. Use a straw to drink if you have pain when drinking out of a glass.   Keep sores clean and dry to prevent an infection of other tissues.   Avoid the sun and limit stress if these things trigger outbreaks. If sun causes cold sores, apply sunscreen on the lips before being out in the sun.  SEEK MEDICAL  CARE IF:   You have a fever or persistent symptoms for more than 2-3 days.   You have a fever and your symptoms suddenly get worse.   You have pus, not clear fluid, coming from the sores.   You have redness that is spreading.   You have pain or irritation in your eye.   You get sores on your genitals.   Your sores do not heal within 2 weeks.   You have a weakened immune system.   You have frequent recurrences of cold sores.  MAKE SURE YOU:   Understand these instructions.  Will watch your condition.  Will get help right away if you are not doing well or get worse. Document Released: 08/14/2000 Document Revised: 01/01/2014 Document Reviewed: 12/30/2011 [facility] Patient Information 2015 [employer], [place]. This information is not intended to replace advice given to you by your health care provider. Make sure you discuss any questions you have with your health care provider.   General Headache Without Cause A headache is pain or discomfort felt around the head or neck area. The specific cause of a headache may not be found. There are many causes and types of headaches. A few common ones are:  Tension headaches.  Migraine headaches.  Cluster headaches.  Chronic daily headaches. HOME CARE INSTRUCTIONS   Keep all follow-up appointments with your caregiver or any specialist referral.  Only take over-the-counter or prescription medicines for pain or discomfort as directed by your caregiver.  Lie down in a dark, quiet room when you have a headache.  Keep a headache journal to find out what may trigger your migraine headaches. For example, write down:  What you eat and drink.  How much sleep you get.  Any change to your diet or medicines.  Try massage or other relaxation techniques.  Put ice packs or heat on the head and neck. Use these 3 to 4 times per day for 15 to 20 minutes each time, or as needed.  Limit stress.  Sit up straight, and do not tense your  muscles.  Quit smoking if you smoke.  Limit alcohol use.  Decrease the amount of caffeine you drink, or stop drinking caffeine.  Eat and sleep on a regular schedule.  Get 7 to 9 hours of sleep, or as recommended by your caregiver.  Keep lights dim if bright lights bother you and make your headaches worse. SEEK MEDICAL CARE IF:   You have problems with the medicines you were prescribed.  Your medicines are not working.  You have a change from the usual headache.  You have nausea or vomiting. SEEK IMMEDIATE MEDICAL CARE IF:   Your headache becomes severe.  You have a fever.  You have a stiff neck.  You have loss of vision.  You have muscular weakness or loss of muscle control.  You start losing your balance or have  trouble walking.  You feel faint or pass out.  You have severe symptoms that are different from your first symptoms. MAKE SURE YOU:   Understand these instructions.  Will watch your condition.  Will get help right away if you are not doing well or get worse. Document Released: 08/17/2005 Document Revised: 11/09/2011 Document Reviewed: 09/02/2011 [facility] Patient Information 2015 [place] Place[employer]Care, [place]. This information is not intended to replace advice given to you by your health care provider. Make sure you discuss any questions you have with your health care provider.

## 2014-11-27 ENCOUNTER — Emergency Department (HOSPITAL_COMMUNITY)
Admission: EM | Admit: 2014-11-27 | Discharge: 2014-11-27 | Disposition: A | Discharge status: Home / Self Care | Payer: Medicaid Other | Source: Home / Self Care | Attending: Emergency Medicine | Admitting: Emergency Medicine

## 2014-11-27 ENCOUNTER — Encounter (HOSPITAL_COMMUNITY): Payer: Self-pay | Admitting: Emergency Medicine

## 2014-11-27 DIAGNOSIS — M549 Dorsalgia, unspecified: Secondary | ICD-10-CM | POA: Diagnosis not present

## 2014-11-27 MED ORDER — TRAMADOL HCL 50 MG PO TABS: 50.0000 mg | ORAL_TABLET | Freq: Four times a day (QID) | ORAL | Status: DC | PRN

## 2014-11-27 MED ORDER — MELOXICAM 15 MG PO TABS: 15.0000 mg | ORAL_TABLET | Freq: Every day | ORAL | Status: DC

## 2014-11-27 MED ORDER — CYCLOBENZAPRINE HCL 10 MG PO TABS: 10.0000 mg | ORAL_TABLET | Freq: Every day | ORAL | Status: DC

## 2014-11-27 NOTE — Discharge Instructions (Signed)
Take Flexeril at bedtime. This will help you sleep. Take meloxicam daily for the next week. Do not take ibuprofen with this medicine. Use tramadol every 6 hours as needed. Do not drive while taking this medicine. Follow-up if no improvement in 1 week.

## 2014-11-27 NOTE — ED Notes (Signed)
Pt states that her back has been hurting since 11/23/2014 pt denies and fall or injury
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
## 2014-12-12 ENCOUNTER — Ambulatory Visit
Admission: RE | Admit: 2014-12-12 | Discharge: 2014-12-12 | Disposition: A | Discharge status: Home / Self Care | Payer: Medicaid Other | Source: Ambulatory Visit | Attending: Family Medicine | Admitting: Family Medicine

## 2014-12-12 ENCOUNTER — Other Ambulatory Visit: Payer: Self-pay | Admitting: Family Medicine

## 2014-12-12 DIAGNOSIS — M545 Low back pain: Secondary | ICD-10-CM

## 2014-12-12 DIAGNOSIS — M549 Dorsalgia, unspecified: Secondary | ICD-10-CM

## 2015-05-22 ENCOUNTER — Encounter: Payer: Self-pay | Admitting: Obstetrics and Gynecology

## 2015-06-07 ENCOUNTER — Other Ambulatory Visit: Payer: Self-pay | Admitting: Family Medicine

## 2015-06-07 DIAGNOSIS — M545 Low back pain: Principal | ICD-10-CM

## 2015-06-07 DIAGNOSIS — G8929 Other chronic pain: Secondary | ICD-10-CM

## 2015-07-09 ENCOUNTER — Ambulatory Visit
Admission: RE | Admit: 2015-07-09 | Discharge: 2015-07-09 | Disposition: A | Discharge status: Home / Self Care | Payer: Medicaid Other | Source: Ambulatory Visit | Attending: Family Medicine | Admitting: Family Medicine

## 2015-07-09 DIAGNOSIS — M545 Low back pain, unspecified: Secondary | ICD-10-CM

## 2015-07-09 DIAGNOSIS — G8929 Other chronic pain: Secondary | ICD-10-CM

## 2016-09-03 ENCOUNTER — Ambulatory Visit
Admission: RE | Admit: 2016-09-03 | Discharge: 2016-09-03 | Disposition: A | Discharge status: Home / Self Care | Payer: Medicaid Other | Source: Ambulatory Visit | Attending: Anesthesiology | Admitting: Anesthesiology

## 2016-09-03 ENCOUNTER — Other Ambulatory Visit: Payer: Self-pay | Admitting: Anesthesiology

## 2016-09-03 DIAGNOSIS — M545 Low back pain: Secondary | ICD-10-CM

## 2017-09-19 ENCOUNTER — Other Ambulatory Visit: Payer: Self-pay | Admitting: Pain Medicine

## 2017-09-19 DIAGNOSIS — M545 Low back pain: Secondary | ICD-10-CM

## 2019-09-01 HISTORY — DX: Maternal care for unspecified type scar from previous cesarean delivery: O34.219

## 2020-01-05 LAB — OB RESULTS CONSOLE RUBELLA ANTIBODY, IGM: Rubella: IMMUNE

## 2020-01-05 LAB — OB RESULTS CONSOLE RPR: RPR: NONREACTIVE

## 2020-01-05 LAB — OB RESULTS CONSOLE ANTIBODY SCREEN: Antibody Screen: NEGATIVE

## 2020-01-05 LAB — OB RESULTS CONSOLE GC/CHLAMYDIA
Chlamydia: NEGATIVE
Gonorrhea: NEGATIVE

## 2020-01-05 LAB — OB RESULTS CONSOLE HEPATITIS B SURFACE ANTIGEN: Hepatitis B Surface Ag: NEGATIVE

## 2020-01-05 LAB — OB RESULTS CONSOLE ABO/RH: RH Type: POSITIVE

## 2020-01-05 LAB — OB RESULTS CONSOLE HIV ANTIBODY (ROUTINE TESTING): HIV: NONREACTIVE

## 2020-01-08 ENCOUNTER — Other Ambulatory Visit: Payer: Self-pay | Admitting: Obstetrics and Gynecology

## 2020-01-08 DIAGNOSIS — Z3689 Encounter for other specified antenatal screening: Secondary | ICD-10-CM

## 2020-01-08 DIAGNOSIS — O30009 Twin pregnancy, unspecified number of placenta and unspecified number of amniotic sacs, unspecified trimester: Secondary | ICD-10-CM

## 2020-01-22 ENCOUNTER — Encounter: Payer: Self-pay | Admitting: *Deleted

## 2020-01-23 ENCOUNTER — Other Ambulatory Visit: Payer: Self-pay

## 2020-01-23 ENCOUNTER — Other Ambulatory Visit: Payer: Self-pay | Admitting: Obstetrics and Gynecology

## 2020-01-23 ENCOUNTER — Ambulatory Visit: Payer: Medicaid Other | Attending: Obstetrics and Gynecology

## 2020-01-23 DIAGNOSIS — E669 Obesity, unspecified: Secondary | ICD-10-CM

## 2020-01-23 DIAGNOSIS — Z363 Encounter for antenatal screening for malformations: Secondary | ICD-10-CM

## 2020-01-23 DIAGNOSIS — Z3689 Encounter for other specified antenatal screening: Secondary | ICD-10-CM

## 2020-01-23 DIAGNOSIS — O30009 Twin pregnancy, unspecified number of placenta and unspecified number of amniotic sacs, unspecified trimester: Secondary | ICD-10-CM | POA: Insufficient documentation

## 2020-01-23 DIAGNOSIS — O09292 Supervision of pregnancy with other poor reproductive or obstetric history, second trimester: Secondary | ICD-10-CM

## 2020-01-23 DIAGNOSIS — O99212 Obesity complicating pregnancy, second trimester: Secondary | ICD-10-CM

## 2020-01-23 DIAGNOSIS — O402XX0 Polyhydramnios, second trimester, not applicable or unspecified: Secondary | ICD-10-CM

## 2020-01-23 DIAGNOSIS — Z3A26 26 weeks gestation of pregnancy: Secondary | ICD-10-CM

## 2020-01-23 DIAGNOSIS — O34219 Maternal care for unspecified type scar from previous cesarean delivery: Secondary | ICD-10-CM

## 2020-01-23 DIAGNOSIS — O09522 Supervision of elderly multigravida, second trimester: Secondary | ICD-10-CM

## 2020-03-07 ENCOUNTER — Inpatient Hospital Stay (HOSPITAL_COMMUNITY)
Admission: AD | Admit: 2020-03-07 | Discharge: 2020-03-07 | Disposition: A | Discharge status: Home / Self Care | Payer: Medicaid Other | Attending: Obstetrics & Gynecology | Admitting: Obstetrics & Gynecology

## 2020-03-07 ENCOUNTER — Other Ambulatory Visit: Payer: Self-pay

## 2020-03-07 ENCOUNTER — Encounter (HOSPITAL_COMMUNITY): Payer: Self-pay | Admitting: Obstetrics & Gynecology

## 2020-03-07 DIAGNOSIS — Z880 Allergy status to penicillin: Secondary | ICD-10-CM | POA: Insufficient documentation

## 2020-03-07 DIAGNOSIS — Z8759 Personal history of other complications of pregnancy, childbirth and the puerperium: Secondary | ICD-10-CM | POA: Diagnosis not present

## 2020-03-07 DIAGNOSIS — O4703 False labor before 37 completed weeks of gestation, third trimester: Secondary | ICD-10-CM | POA: Diagnosis not present

## 2020-03-07 DIAGNOSIS — O09293 Supervision of pregnancy with other poor reproductive or obstetric history, third trimester: Secondary | ICD-10-CM | POA: Insufficient documentation

## 2020-03-07 DIAGNOSIS — O479 False labor, unspecified: Secondary | ICD-10-CM

## 2020-03-07 DIAGNOSIS — O368330 Maternal care for abnormalities of the fetal heart rate or rhythm, third trimester, not applicable or unspecified: Secondary | ICD-10-CM | POA: Insufficient documentation

## 2020-03-07 DIAGNOSIS — O133 Gestational [pregnancy-induced] hypertension without significant proteinuria, third trimester: Secondary | ICD-10-CM | POA: Diagnosis not present

## 2020-03-07 DIAGNOSIS — Z87891 Personal history of nicotine dependence: Secondary | ICD-10-CM | POA: Diagnosis not present

## 2020-03-07 DIAGNOSIS — Z791 Long term (current) use of non-steroidal anti-inflammatories (NSAID): Secondary | ICD-10-CM | POA: Diagnosis not present

## 2020-03-07 DIAGNOSIS — Z3A33 33 weeks gestation of pregnancy: Secondary | ICD-10-CM

## 2020-03-07 DIAGNOSIS — O98513 Other viral diseases complicating pregnancy, third trimester: Secondary | ICD-10-CM | POA: Insufficient documentation

## 2020-03-07 DIAGNOSIS — Z79899 Other long term (current) drug therapy: Secondary | ICD-10-CM | POA: Diagnosis not present

## 2020-03-07 DIAGNOSIS — Z98891 History of uterine scar from previous surgery: Secondary | ICD-10-CM | POA: Insufficient documentation

## 2020-03-07 DIAGNOSIS — M419 Scoliosis, unspecified: Secondary | ICD-10-CM | POA: Insufficient documentation

## 2020-03-07 LAB — URINALYSIS, ROUTINE W REFLEX MICROSCOPIC
Bilirubin Urine: NEGATIVE
Glucose, UA: NEGATIVE mg/dL
Hgb urine dipstick: NEGATIVE
Ketones, ur: NEGATIVE mg/dL
Nitrite: NEGATIVE
Protein, ur: NEGATIVE mg/dL
Specific Gravity, Urine: 1.011 (ref 1.005–1.030)
pH: 7 (ref 5.0–8.0)

## 2020-03-07 LAB — CBC
HCT: 35.6 % — ABNORMAL LOW (ref 36.0–46.0)
Hemoglobin: 11.9 g/dL — ABNORMAL LOW (ref 12.0–15.0)
MCH: 30.4 pg (ref 26.0–34.0)
MCHC: 33.4 g/dL (ref 30.0–36.0)
MCV: 90.8 fL (ref 80.0–100.0)
Platelets: 175 10*3/uL (ref 150–400)
RBC: 3.92 MIL/uL (ref 3.87–5.11)
RDW: 13.8 % (ref 11.5–15.5)
WBC: 12.5 10*3/uL — ABNORMAL HIGH (ref 4.0–10.5)
nRBC: 0 % (ref 0.0–0.2)

## 2020-03-07 LAB — PROTEIN / CREATININE RATIO, URINE
Creatinine, Urine: 82.72 mg/dL
Protein Creatinine Ratio: 0.22 mg/mg{Cre} — ABNORMAL HIGH (ref 0.00–0.15)
Total Protein, Urine: 18 mg/dL

## 2020-03-07 LAB — COMPREHENSIVE METABOLIC PANEL
ALT: 8 U/L (ref 0–44)
AST: 10 U/L — ABNORMAL LOW (ref 15–41)
Albumin: 2.9 g/dL — ABNORMAL LOW (ref 3.5–5.0)
Alkaline Phosphatase: 61 U/L (ref 38–126)
Anion gap: 10 (ref 5–15)
BUN: 5 mg/dL — ABNORMAL LOW (ref 6–20)
CO2: 19 mmol/L — ABNORMAL LOW (ref 22–32)
Calcium: 8.8 mg/dL — ABNORMAL LOW (ref 8.9–10.3)
Chloride: 108 mmol/L (ref 98–111)
Creatinine, Ser: 0.51 mg/dL (ref 0.44–1.00)
GFR calc Af Amer: >60 mL/min (ref >60)
GFR calc non Af Amer: >60 mL/min (ref >60)
Glucose, Bld: 73 mg/dL (ref 70–99)
Potassium: 3.7 mmol/L (ref 3.5–5.1)
Sodium: 137 mmol/L (ref 135–145)
Total Bilirubin: 0.1 mg/dL — ABNORMAL LOW (ref 0.3–1.2)
Total Protein: 6.4 g/dL — ABNORMAL LOW (ref 6.5–8.1)

## 2020-03-07 MED ORDER — NIFEDIPINE 10 MG PO CAPS
10.0000 mg | ORAL_CAPSULE | ORAL | Status: AC | PRN
  Administered 2020-03-07 (×3): 10 mg via ORAL
  Filled 2020-03-07 (×3): qty 1

## 2020-03-07 MED ORDER — LACTATED RINGERS IV BOLUS
1000.0000 mL | Freq: Once | INTRAVENOUS | Status: AC
  Administered 2020-03-07: 1000 mL via INTRAVENOUS
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
## 2020-03-07 NOTE — Discharge Instructions (Signed)

## 2020-03-07 NOTE — MAU Note (Signed)
Went for dr's office. Baby is measuring a month ahead, also feeling a lot of pressure and braxton hicks.  So they put her on the monitor for a NST and the baby's heart beat was high.

## 2020-03-18 ENCOUNTER — Other Ambulatory Visit: Payer: Self-pay | Admitting: Obstetrics and Gynecology

## 2020-03-22 NOTE — Patient Instructions (Signed)
[name]  03/22/2020   Your procedure is scheduled on:  04/03/2020  Arrive at 0815 at Entrance C on [address] at [facility]  and [address]. You are invited to use the FREE valet parking or use the Visitor's parking deck.  Pick up the phone at the desk and dial [phone].  Call this number if you have problems the morning of surgery: [phone]  Remember:   Do not eat food:(After Midnight) Desps de medianoche.  Do not drink clear liquids: (After Midnight) Desps de medianoche.  Take these medicines the morning of surgery with A SIP OF WATER:  none   Do not wear jewelry, make-up or nail polish.  Do not wear lotions, powders, or perfumes. Do not wear deodorant.  Do not shave 48 hours prior to surgery.  Do not bring valuables to the hospital.  [facility] is not   responsible for any belongings or valuables brought to the hospital.  Contacts, dentures or bridgework may not be worn into surgery.  Leave suitcase in the car. After surgery it may be brought to your room.  For patients admitted to the hospital, checkout time is 11:00 AM the day of              discharge.      Please read over the following fact sheets that you were given:     Preparing for Surgery

## 2020-03-26 ENCOUNTER — Telehealth (HOSPITAL_COMMUNITY): Payer: Self-pay | Admitting: *Deleted

## 2020-03-26 NOTE — Telephone Encounter (Signed)
Preadmission screen  

## 2020-03-27 ENCOUNTER — Encounter (HOSPITAL_COMMUNITY): Payer: Self-pay
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
## 2020-03-29 DIAGNOSIS — O0930 Supervision of pregnancy with insufficient antenatal care, unspecified trimester: Secondary | ICD-10-CM | POA: Insufficient documentation

## 2020-03-29 DIAGNOSIS — G8929 Other chronic pain: Secondary | ICD-10-CM

## 2020-03-29 DIAGNOSIS — Z8751 Personal history of pre-term labor: Secondary | ICD-10-CM | POA: Insufficient documentation

## 2020-03-29 DIAGNOSIS — O139 Gestational [pregnancy-induced] hypertension without significant proteinuria, unspecified trimester: Secondary | ICD-10-CM | POA: Diagnosis present

## 2020-03-29 DIAGNOSIS — O99210 Obesity complicating pregnancy, unspecified trimester: Secondary | ICD-10-CM | POA: Diagnosis present

## 2020-03-29 DIAGNOSIS — O09529 Supervision of elderly multigravida, unspecified trimester: Secondary | ICD-10-CM

## 2020-03-29 DIAGNOSIS — O409XX0 Polyhydramnios, unspecified trimester, not applicable or unspecified: Secondary | ICD-10-CM | POA: Diagnosis present

## 2020-03-29 HISTORY — DX: Other chronic pain: G89.29

## 2020-04-01 ENCOUNTER — Other Ambulatory Visit: Payer: Self-pay

## 2020-04-01 ENCOUNTER — Other Ambulatory Visit (HOSPITAL_COMMUNITY)
Admission: RE | Admit: 2020-04-01 | Discharge: 2020-04-01 | Disposition: A | Discharge status: Home / Self Care | Payer: Medicaid Other | Source: Ambulatory Visit | Attending: Obstetrics and Gynecology | Admitting: Obstetrics and Gynecology

## 2020-04-01 DIAGNOSIS — Z20822 Contact with and (suspected) exposure to covid-19: Secondary | ICD-10-CM | POA: Diagnosis not present

## 2020-04-01 DIAGNOSIS — Z01812 Encounter for preprocedural laboratory examination: Secondary | ICD-10-CM | POA: Insufficient documentation

## 2020-04-01 LAB — COMPREHENSIVE METABOLIC PANEL
ALT: 9 U/L (ref 0–44)
AST: 13 U/L — ABNORMAL LOW (ref 15–41)
Albumin: 2.8 g/dL — ABNORMAL LOW (ref 3.5–5.0)
Alkaline Phosphatase: 66 U/L (ref 38–126)
Anion gap: 11 (ref 5–15)
BUN: 7 mg/dL (ref 6–20)
CO2: 18 mmol/L — ABNORMAL LOW (ref 22–32)
Calcium: 8.7 mg/dL — ABNORMAL LOW (ref 8.9–10.3)
Chloride: 108 mmol/L (ref 98–111)
Creatinine, Ser: 0.58 mg/dL (ref 0.44–1.00)
GFR calc Af Amer: >60 mL/min (ref >60)
GFR calc non Af Amer: >60 mL/min (ref >60)
Glucose, Bld: 87 mg/dL (ref 70–99)
Potassium: 3.6 mmol/L (ref 3.5–5.1)
Sodium: 137 mmol/L (ref 135–145)
Total Bilirubin: 0.5 mg/dL (ref 0.3–1.2)
Total Protein: 5.9 g/dL — ABNORMAL LOW (ref 6.5–8.1)

## 2020-04-01 LAB — CBC
HCT: 34.5 % — ABNORMAL LOW (ref 36.0–46.0)
Hemoglobin: 11.5 g/dL — ABNORMAL LOW (ref 12.0–15.0)
MCH: 29.7 pg (ref 26.0–34.0)
MCHC: 33.3 g/dL (ref 30.0–36.0)
MCV: 89.1 fL (ref 80.0–100.0)
Platelets: 158 10*3/uL (ref 150–400)
RBC: 3.87 MIL/uL (ref 3.87–5.11)
RDW: 13.7 % (ref 11.5–15.5)
WBC: 9.5 10*3/uL (ref 4.0–10.5)
nRBC: 0 % (ref 0.0–0.2)

## 2020-04-01 LAB — TYPE AND SCREEN
ABO/RH(D): A POS
Antibody Screen: NEGATIVE

## 2020-04-01 LAB — SARS CORONAVIRUS 2 (TAT 6-24 HRS): SARS Coronavirus 2: NEGATIVE

## 2020-04-01 NOTE — MAU Note (Signed)
Covid swab collected. Pt asymptomatic. Waiting for CBC/RPR/Type and screen to be drawn 

## 2020-04-02 LAB — RPR: RPR Ser Ql: NONREACTIVE

## 2020-04-03 ENCOUNTER — Encounter (HOSPITAL_COMMUNITY)
Admission: RE | Disposition: A | Discharge status: Home / Self Care | Payer: Self-pay | Source: Home / Self Care | Attending: Obstetrics and Gynecology

## 2020-04-03 ENCOUNTER — Other Ambulatory Visit: Payer: Self-pay

## 2020-04-03 ENCOUNTER — Inpatient Hospital Stay (HOSPITAL_COMMUNITY): Payer: Medicaid Other | Admitting: Anesthesiology

## 2020-04-03 ENCOUNTER — Inpatient Hospital Stay (HOSPITAL_COMMUNITY)
Admission: RE | Admit: 2020-04-03 | Discharge: 2020-04-05 | DRG: 788 | Disposition: A | Discharge status: Home / Self Care | Payer: Medicaid Other | Attending: Obstetrics and Gynecology | Admitting: Obstetrics and Gynecology

## 2020-04-03 ENCOUNTER — Encounter (HOSPITAL_COMMUNITY): Payer: Self-pay | Admitting: Obstetrics and Gynecology

## 2020-04-03 DIAGNOSIS — O09529 Supervision of elderly multigravida, unspecified trimester: Secondary | ICD-10-CM

## 2020-04-03 DIAGNOSIS — Z98891 History of uterine scar from previous surgery: Secondary | ICD-10-CM

## 2020-04-03 DIAGNOSIS — O134 Gestational [pregnancy-induced] hypertension without significant proteinuria, complicating childbirth: Secondary | ICD-10-CM | POA: Diagnosis present

## 2020-04-03 DIAGNOSIS — O403XX0 Polyhydramnios, third trimester, not applicable or unspecified: Secondary | ICD-10-CM | POA: Diagnosis present

## 2020-04-03 DIAGNOSIS — Z87891 Personal history of nicotine dependence: Secondary | ICD-10-CM | POA: Diagnosis not present

## 2020-04-03 DIAGNOSIS — O139 Gestational [pregnancy-induced] hypertension without significant proteinuria, unspecified trimester: Secondary | ICD-10-CM | POA: Diagnosis present

## 2020-04-03 DIAGNOSIS — O99824 Streptococcus B carrier state complicating childbirth: Secondary | ICD-10-CM | POA: Diagnosis present

## 2020-04-03 DIAGNOSIS — O34219 Maternal care for unspecified type scar from previous cesarean delivery: Secondary | ICD-10-CM | POA: Insufficient documentation

## 2020-04-03 DIAGNOSIS — O34211 Maternal care for low transverse scar from previous cesarean delivery: Secondary | ICD-10-CM | POA: Diagnosis present

## 2020-04-03 DIAGNOSIS — O99210 Obesity complicating pregnancy, unspecified trimester: Secondary | ICD-10-CM | POA: Diagnosis present

## 2020-04-03 DIAGNOSIS — R519 Headache, unspecified: Secondary | ICD-10-CM | POA: Diagnosis not present

## 2020-04-03 DIAGNOSIS — G8929 Other chronic pain: Secondary | ICD-10-CM | POA: Diagnosis not present

## 2020-04-03 DIAGNOSIS — O99214 Obesity complicating childbirth: Secondary | ICD-10-CM | POA: Diagnosis present

## 2020-04-03 DIAGNOSIS — Z3A37 37 weeks gestation of pregnancy: Secondary | ICD-10-CM

## 2020-04-03 DIAGNOSIS — R7689 Other specified abnormal immunological findings in serum: Secondary | ICD-10-CM | POA: Diagnosis present

## 2020-04-03 DIAGNOSIS — O409XX0 Polyhydramnios, unspecified trimester, not applicable or unspecified: Secondary | ICD-10-CM | POA: Diagnosis present

## 2020-04-03 DIAGNOSIS — R768 Other specified abnormal immunological findings in serum: Secondary | ICD-10-CM | POA: Diagnosis present

## 2020-04-03 HISTORY — DX: History of uterine scar from previous surgery: Z98.891

## 2020-04-03 SURGERY — Surgical Case
Anesthesia: Spinal

## 2020-04-03 MED ORDER — HYDROMORPHONE HCL 1 MG/ML IJ SOLN
INTRAMUSCULAR | Status: AC
  Filled 2020-04-03: qty 0.5

## 2020-04-03 MED ORDER — LACTATED RINGERS IV SOLN: INTRAVENOUS | Status: DC

## 2020-04-03 MED ORDER — SCOPOLAMINE 1 MG/3DAYS TD PT72
MEDICATED_PATCH | TRANSDERMAL | Status: DC | PRN
  Administered 2020-04-03: 1 via TRANSDERMAL

## 2020-04-03 MED ORDER — OXYCODONE HCL 5 MG PO TABS
5.0000 mg | ORAL_TABLET | ORAL | Status: DC | PRN
  Administered 2020-04-04 – 2020-04-05 (×7): 10 mg via ORAL
  Filled 2020-04-03 (×7): qty 2

## 2020-04-03 MED ORDER — COCONUT OIL OIL: 1.0000 "application " | TOPICAL_OIL | Status: DC | PRN

## 2020-04-03 MED ORDER — KETOROLAC TROMETHAMINE 30 MG/ML IJ SOLN
30.0000 mg | Freq: Once | INTRAMUSCULAR | Status: AC | PRN
  Administered 2020-04-03: 30 mg via INTRAVENOUS

## 2020-04-03 MED ORDER — KETOROLAC TROMETHAMINE 30 MG/ML IJ SOLN
INTRAMUSCULAR | Status: AC
  Filled 2020-04-03: qty 1

## 2020-04-03 MED ORDER — TETANUS-DIPHTH-ACELL PERTUSSIS 5-2.5-18.5 LF-MCG/0.5 IM SUSP: 0.5000 mL | Freq: Once | INTRAMUSCULAR | Status: DC

## 2020-04-03 MED ORDER — MEPERIDINE HCL 25 MG/ML IJ SOLN: 6.2500 mg | INTRAMUSCULAR | Status: DC | PRN

## 2020-04-03 MED ORDER — SODIUM CHLORIDE 0.9% FLUSH: 3.0000 mL | INTRAVENOUS | Status: DC | PRN

## 2020-04-03 MED ORDER — PRENATAL MULTIVITAMIN CH
1.0000 | ORAL_TABLET | Freq: Every day | ORAL | Status: DC
  Administered 2020-04-03 – 2020-04-05 (×3): 1 via ORAL
  Filled 2020-04-03 (×3): qty 1

## 2020-04-03 MED ORDER — SCOPOLAMINE 1 MG/3DAYS TD PT72: 1.0000 | MEDICATED_PATCH | Freq: Once | TRANSDERMAL | Status: DC

## 2020-04-03 MED ORDER — METOCLOPRAMIDE HCL 5 MG/ML IJ SOLN
INTRAMUSCULAR | Status: AC
  Filled 2020-04-03: qty 2

## 2020-04-03 MED ORDER — ACETAMINOPHEN 500 MG PO TABS: 1000.0000 mg | ORAL_TABLET | Freq: Four times a day (QID) | ORAL | Status: DC

## 2020-04-03 MED ORDER — ONDANSETRON HCL 4 MG/2ML IJ SOLN: 4.0000 mg | Freq: Three times a day (TID) | INTRAMUSCULAR | Status: DC | PRN

## 2020-04-03 MED ORDER — BUTALBITAL-APAP-CAFFEINE 50-325-40 MG PO TABS
1.0000 | ORAL_TABLET | ORAL | Status: DC | PRN
  Administered 2020-04-03: 1 via ORAL
  Filled 2020-04-03: qty 1

## 2020-04-03 MED ORDER — NALBUPHINE HCL 10 MG/ML IJ SOLN: 5.0000 mg | Freq: Once | INTRAMUSCULAR | Status: DC | PRN

## 2020-04-03 MED ORDER — DIPHENHYDRAMINE HCL 25 MG PO CAPS: 25.0000 mg | ORAL_CAPSULE | Freq: Four times a day (QID) | ORAL | Status: DC | PRN

## 2020-04-03 MED ORDER — NALBUPHINE HCL 10 MG/ML IJ SOLN: 5.0000 mg | INTRAMUSCULAR | Status: DC | PRN

## 2020-04-03 MED ORDER — PHENYLEPHRINE HCL-NACL 20-0.9 MG/250ML-% IV SOLN
INTRAVENOUS | Status: DC | PRN
  Administered 2020-04-03: 60 ug/min via INTRAVENOUS

## 2020-04-03 MED ORDER — DEXAMETHASONE SODIUM PHOSPHATE 4 MG/ML IJ SOLN
INTRAMUSCULAR | Status: AC
  Filled 2020-04-03: qty 2

## 2020-04-03 MED ORDER — PHENYLEPHRINE 40 MCG/ML (10ML) SYRINGE FOR IV PUSH (FOR BLOOD PRESSURE SUPPORT)
PREFILLED_SYRINGE | INTRAVENOUS | Status: AC
  Filled 2020-04-03: qty 10

## 2020-04-03 MED ORDER — ZOLPIDEM TARTRATE 5 MG PO TABS: 5.0000 mg | ORAL_TABLET | Freq: Every evening | ORAL | Status: DC | PRN

## 2020-04-03 MED ORDER — LACTATED RINGERS IV SOLN: INTRAVENOUS | Status: DC | PRN

## 2020-04-03 MED ORDER — SIMETHICONE 80 MG PO CHEW: 80.0000 mg | CHEWABLE_TABLET | ORAL | Status: DC | PRN

## 2020-04-03 MED ORDER — ENOXAPARIN SODIUM 60 MG/0.6ML ~~LOC~~ SOLN
0.5000 mg/kg | SUBCUTANEOUS | Status: DC
  Administered 2020-04-04 – 2020-04-05 (×2): 60 mg via SUBCUTANEOUS
  Filled 2020-04-03 (×2): qty 0.6

## 2020-04-03 MED ORDER — FENTANYL CITRATE (PF) 100 MCG/2ML IJ SOLN
INTRAMUSCULAR | Status: DC | PRN
  Administered 2020-04-03: 15 ug via INTRATHECAL

## 2020-04-03 MED ORDER — HYDROMORPHONE HCL 1 MG/ML IJ SOLN
0.2500 mg | INTRAMUSCULAR | Status: DC | PRN
  Administered 2020-04-03: 0.5 mg via INTRAVENOUS
  Administered 2020-04-03 (×2): 0.25 mg via INTRAVENOUS

## 2020-04-03 MED ORDER — KETOROLAC TROMETHAMINE 30 MG/ML IJ SOLN
30.0000 mg | Freq: Four times a day (QID) | INTRAMUSCULAR | Status: AC
  Administered 2020-04-03 – 2020-04-04 (×3): 30 mg via INTRAVENOUS
  Filled 2020-04-03 (×4): qty 1

## 2020-04-03 MED ORDER — MENTHOL 3 MG MT LOZG: 1.0000 | LOZENGE | OROMUCOSAL | Status: DC | PRN

## 2020-04-03 MED ORDER — SIMETHICONE 80 MG PO CHEW
80.0000 mg | CHEWABLE_TABLET | ORAL | Status: DC
  Administered 2020-04-03 – 2020-04-04 (×2): 80 mg via ORAL
  Filled 2020-04-03 (×3): qty 1

## 2020-04-03 MED ORDER — MORPHINE SULFATE (PF) 0.5 MG/ML IJ SOLN
INTRAMUSCULAR | Status: AC
  Filled 2020-04-03: qty 10

## 2020-04-03 MED ORDER — ONDANSETRON HCL 4 MG/2ML IJ SOLN
INTRAMUSCULAR | Status: AC
  Filled 2020-04-03: qty 2

## 2020-04-03 MED ORDER — DEXAMETHASONE SODIUM PHOSPHATE 4 MG/ML IJ SOLN
INTRAMUSCULAR | Status: DC | PRN
  Administered 2020-04-03: 4 mg via INTRAVENOUS

## 2020-04-03 MED ORDER — KETOROLAC TROMETHAMINE 30 MG/ML IJ SOLN: 30.0000 mg | Freq: Four times a day (QID) | INTRAMUSCULAR | Status: AC | PRN

## 2020-04-03 MED ORDER — GENTAMICIN SULFATE 40 MG/ML IJ SOLN
5.0000 mg/kg | INTRAVENOUS | Status: AC
  Administered 2020-04-03: 590 mg via INTRAVENOUS
  Filled 2020-04-03 (×2): qty 14.75

## 2020-04-03 MED ORDER — SIMETHICONE 80 MG PO CHEW
80.0000 mg | CHEWABLE_TABLET | Freq: Three times a day (TID) | ORAL | Status: DC
  Administered 2020-04-03 – 2020-04-05 (×6): 80 mg via ORAL
  Filled 2020-04-03 (×6): qty 1

## 2020-04-03 MED ORDER — SENNOSIDES-DOCUSATE SODIUM 8.6-50 MG PO TABS
2.0000 | ORAL_TABLET | ORAL | Status: DC
  Administered 2020-04-03 – 2020-04-04 (×2): 2 via ORAL
  Filled 2020-04-03 (×2): qty 2

## 2020-04-03 MED ORDER — OXYTOCIN-SODIUM CHLORIDE 30-0.9 UT/500ML-% IV SOLN
INTRAVENOUS | Status: AC
  Filled 2020-04-03: qty 500

## 2020-04-03 MED ORDER — DIBUCAINE (PERIANAL) 1 % EX OINT: 1.0000 "application " | TOPICAL_OINTMENT | CUTANEOUS | Status: DC | PRN

## 2020-04-03 MED ORDER — METOCLOPRAMIDE HCL 5 MG/ML IJ SOLN
INTRAMUSCULAR | Status: DC | PRN
  Administered 2020-04-03 (×2): 5 mg via INTRAVENOUS

## 2020-04-03 MED ORDER — ACETAMINOPHEN 500 MG PO TABS
1000.0000 mg | ORAL_TABLET | Freq: Four times a day (QID) | ORAL | Status: AC
  Administered 2020-04-03 – 2020-04-04 (×3): 1000 mg via ORAL
  Filled 2020-04-03 (×3): qty 2

## 2020-04-03 MED ORDER — NALOXONE HCL 4 MG/10ML IJ SOLN
1.0000 ug/kg/h | INTRAVENOUS | Status: DC | PRN
  Filled 2020-04-03: qty 5

## 2020-04-03 MED ORDER — IBUPROFEN 800 MG PO TABS
800.0000 mg | ORAL_TABLET | Freq: Four times a day (QID) | ORAL | Status: DC
  Administered 2020-04-04 – 2020-04-05 (×5): 800 mg via ORAL
  Filled 2020-04-03 (×5): qty 1

## 2020-04-03 MED ORDER — MORPHINE SULFATE (PF) 0.5 MG/ML IJ SOLN
INTRAMUSCULAR | Status: DC | PRN
  Administered 2020-04-03: .15 mg via INTRATHECAL

## 2020-04-03 MED ORDER — DIPHENHYDRAMINE HCL 50 MG/ML IJ SOLN: 12.5000 mg | INTRAMUSCULAR | Status: DC | PRN

## 2020-04-03 MED ORDER — PHENYLEPHRINE HCL-NACL 20-0.9 MG/250ML-% IV SOLN
INTRAVENOUS | Status: AC
  Filled 2020-04-03: qty 250

## 2020-04-03 MED ORDER — PROMETHAZINE HCL 25 MG/ML IJ SOLN: 6.2500 mg | INTRAMUSCULAR | Status: DC | PRN

## 2020-04-03 MED ORDER — DIPHENHYDRAMINE HCL 25 MG PO CAPS: 25.0000 mg | ORAL_CAPSULE | ORAL | Status: DC | PRN

## 2020-04-03 MED ORDER — NALOXONE HCL 0.4 MG/ML IJ SOLN: 0.4000 mg | INTRAMUSCULAR | Status: DC | PRN

## 2020-04-03 MED ORDER — CLINDAMYCIN PHOSPHATE 900 MG/50ML IV SOLN
900.0000 mg | INTRAVENOUS | Status: AC
  Administered 2020-04-03: 900 mg via INTRAVENOUS

## 2020-04-03 MED ORDER — OXYTOCIN-SODIUM CHLORIDE 30-0.9 UT/500ML-% IV SOLN
INTRAVENOUS | Status: DC | PRN
  Administered 2020-04-03: 450 mL via INTRAVENOUS

## 2020-04-03 MED ORDER — OXYTOCIN-SODIUM CHLORIDE 30-0.9 UT/500ML-% IV SOLN: 2.5000 [IU]/h | INTRAVENOUS | Status: AC

## 2020-04-03 MED ORDER — ONDANSETRON HCL 4 MG/2ML IJ SOLN
INTRAMUSCULAR | Status: DC | PRN
  Administered 2020-04-03: 4 mg via INTRAVENOUS

## 2020-04-03 MED ORDER — FENTANYL CITRATE (PF) 100 MCG/2ML IJ SOLN
INTRAMUSCULAR | Status: AC
  Filled 2020-04-03: qty 2

## 2020-04-03 MED ORDER — BUPIVACAINE IN DEXTROSE 0.75-8.25 % IT SOLN
INTRATHECAL | Status: DC | PRN
  Administered 2020-04-03: 1.6 mL via INTRATHECAL

## 2020-04-03 MED ORDER — CLINDAMYCIN PHOSPHATE 900 MG/50ML IV SOLN
INTRAVENOUS | Status: AC
  Filled 2020-04-03: qty 50

## 2020-04-03 MED ORDER — POVIDONE-IODINE 10 % EX SWAB
2.0000 "application " | Freq: Once | CUTANEOUS | Status: AC
  Administered 2020-04-03: 2 via TOPICAL

## 2020-04-03 MED ORDER — WITCH HAZEL-GLYCERIN EX PADS: 1.0000 "application " | MEDICATED_PAD | CUTANEOUS | Status: DC | PRN

## 2020-04-03 SURGICAL SUPPLY — 39 items
APL SKNCLS STERI-STRIP NONHPOA (GAUZE/BANDAGES/DRESSINGS) ×1
BENZOIN TINCTURE PRP APPL 2/3 (GAUZE/BANDAGES/DRESSINGS) ×3 IMPLANT
CHLORAPREP W/TINT 26ML (MISCELLANEOUS) ×3 IMPLANT
CLAMP CORD UMBIL (MISCELLANEOUS) IMPLANT
CLOSURE STERI STRIP 1/2 X4 (GAUZE/BANDAGES/DRESSINGS) ×2 IMPLANT
CLOTH BEACON ORANGE TIMEOUT ST (SAFETY) ×3 IMPLANT
DRSG OPSITE POSTOP 4X10 (GAUZE/BANDAGES/DRESSINGS) ×3 IMPLANT
ELECT REM PT RETURN 9FT ADLT (ELECTROSURGICAL) ×3
ELECTRODE REM PT RTRN 9FT ADLT (ELECTROSURGICAL) ×1 IMPLANT
EXTENDER TRAXI PANNICULUS (MISCELLANEOUS) IMPLANT
EXTRACTOR VACUUM M CUP 4 TUBE (SUCTIONS) IMPLANT
EXTRACTOR VACUUM M CUP 4' TUBE (SUCTIONS)
GLOVE BIO SURGEON STRL SZ7.5 (GLOVE) ×3 IMPLANT
GLOVE BIOGEL PI IND STRL 7.0 (GLOVE) ×1 IMPLANT
GLOVE BIOGEL PI IND STRL 7.5 (GLOVE) ×1 IMPLANT
GLOVE BIOGEL PI INDICATOR 7.0 (GLOVE) ×2
GLOVE BIOGEL PI INDICATOR 7.5 (GLOVE) ×2
GOWN STRL REUS W/TWL LRG LVL3 (GOWN DISPOSABLE) ×6 IMPLANT
KIT ABG SYR 3ML LUER SLIP (SYRINGE) IMPLANT
NDL HYPO 25X5/8 SAFETYGLIDE (NEEDLE) IMPLANT
NEEDLE HYPO 25X5/8 SAFETYGLIDE (NEEDLE) IMPLANT
NS IRRIG 1000ML POUR BTL (IV SOLUTION) ×3 IMPLANT
PACK C SECTION WH (CUSTOM PROCEDURE TRAY) ×3 IMPLANT
PAD OB MATERNITY 4.3X12.25 (PERSONAL CARE ITEMS) ×3 IMPLANT
PENCIL SMOKE EVAC W/HOLSTER (ELECTROSURGICAL) ×3 IMPLANT
RTRCTR C-SECT PINK 25CM LRG (MISCELLANEOUS) ×3 IMPLANT
STRIP CLOSURE SKIN 1/2X4 (GAUZE/BANDAGES/DRESSINGS) ×2 IMPLANT
SUT CHROMIC 2 0 CT 1 (SUTURE) ×3 IMPLANT
SUT MNCRL AB 3-0 PS2 27 (SUTURE) ×3 IMPLANT
SUT PLAIN 2 0 XLH (SUTURE) ×3 IMPLANT
SUT VIC AB 0 CT1 36 (SUTURE) ×3 IMPLANT
SUT VIC AB 0 CTX 36 (SUTURE) ×9
SUT VIC AB 0 CTX36XBRD ANBCTRL (SUTURE) ×3 IMPLANT
SUT VIC AB 2-0 SH 27 (SUTURE) ×6
SUT VIC AB 2-0 SH 27XBRD (SUTURE) ×2 IMPLANT
TOWEL OR 17X24 6PK STRL BLUE (TOWEL DISPOSABLE) ×3 IMPLANT
TRAXI PANNICULUS EXTENDER (MISCELLANEOUS) ×2
TRAY FOLEY W/BAG SLVR 14FR LF (SET/KITS/TRAYS/PACK) ×3 IMPLANT
WATER STERILE IRR 1000ML POUR (IV SOLUTION) ×3 IMPLANT

## 2020-04-03 NOTE — Transfer of Care (Signed)
Immediate Anesthesia Transfer of Care Note  Patient: [name]  Procedure(s) Performed: CESAREAN SECTION (N/A )  Patient Location: PACU  Anesthesia Type:Spinal  Level of Consciousness: awake, alert  and oriented  Airway & Oxygen Therapy: Patient Spontanous Breathing  Post-op Assessment: Report given to RN and Post -op Vital signs reviewed and stable  Post vital signs: Reviewed and stable  Last Vitals:  Vitals Value Taken Time  BP    Temp    Pulse 73 04/03/20 1157  Resp 14 04/03/20 1157  SpO2 96 % 04/03/20 1157  Vitals shown include unvalidated device data.  Last Pain:  Vitals:   04/03/20 0830  TempSrc: Oral         Complications: No complications documented.

## 2020-04-03 NOTE — Anesthesia Postprocedure Evaluation (Signed)
Anesthesia Post Note  Patient: [name]  Procedure(s) Performed: CESAREAN SECTION (N/A )     Patient location during evaluation: PACU Anesthesia Type: Spinal Level of consciousness: awake Pain management: pain level controlled Vital Signs Assessment: post-procedure vital signs reviewed and stable Respiratory status: spontaneous breathing Cardiovascular status: stable Postop Assessment: no headache, no backache, spinal receding, patient able to bend at knees and no apparent nausea or vomiting Anesthetic complications: no   No complications documented.  Last Vitals:  Vitals:   04/03/20 1245 04/03/20 1300  BP: (!) 101/59   Pulse: 65 (!) 59  Resp: 14 12  Temp:    SpO2: 96% 96%    Last Pain:  Vitals:   04/03/20 1200  TempSrc: Oral   Pain Goal:    LLE Motor Response: Purposeful movement (04/03/20 1230) LLE Sensation: Tingling (04/03/20 1230) RLE Motor Response: Purposeful movement (04/03/20 1230) RLE Sensation: Tingling (04/03/20 1230)     Epidural/Spinal Function Cutaneous sensation: Tingles (04/03/20 1230), Patient able to flex knees: Yes (04/03/20 1230), Patient able to lift hips off bed: No (04/03/20 1230), Back pain beyond tenderness at insertion site: No (04/03/20 1230), Progressively worsening motor and/or sensory loss: No (04/03/20 1230), Bowel and/or bladder incontinence post epidural: No (04/03/20 1230)  Caren Macadam

## 2020-04-03 NOTE — Anesthesia Preprocedure Evaluation (Signed)
Anesthesia Evaluation  Patient identified by MRN, date of birth, ID band Patient awake    Reviewed: Allergy & Precautions, H&P , NPO status , Patient's Chart, lab work & pertinent test results  Airway Mallampati: II  TM Distance: >3 FB Neck ROM: full    Dental no notable dental hx. (+) Teeth Intact   Pulmonary Patient abstained from smoking., former smoker,    Pulmonary exam normal breath sounds clear to auscultation       Cardiovascular hypertension, negative cardio ROS Normal cardiovascular exam Rhythm:regular Rate:Normal     Neuro/Psych  Headaches, PSYCHIATRIC DISORDERS Anxiety Depression    GI/Hepatic negative GI ROS, Neg liver ROS,   Endo/Other  Morbid obesity  Renal/GU negative Renal ROS  negative genitourinary   Musculoskeletal Scoliosis   Abdominal (+) + obese,   Peds  Hematology  (+) Blood dyscrasia, anemia ,   Anesthesia Other Findings   Reproductive/Obstetrics (+) Pregnancy                             Anesthesia Physical Anesthesia Plan  ASA: III  Anesthesia Plan: Spinal   Post-op Pain Management:    Induction:   PONV Risk Score and Plan: 3 and Ondansetron, Dexamethasone and Scopolamine patch - Pre-op  Airway Management Planned: Natural Airway and Nasal Cannula  Additional Equipment: None  Intra-op Plan:   Post-operative Plan:   Informed Consent: I have reviewed the patients History and Physical, chart, labs and discussed the procedure including the risks, benefits and alternatives for the proposed anesthesia with the patient or authorized representative who has indicated his/her understanding and acceptance.       Plan Discussed with:   Anesthesia Plan Comments:         Anesthesia Quick Evaluation

## 2020-04-03 NOTE — Anesthesia Procedure Notes (Signed)
Spinal  Patient location during procedure: OR Start time: 04/03/2020 10:28 AM End time: 04/03/2020 10:31 AM Staffing Performed: anesthesiologist  Anesthesiologist: Leilani Able, MD Preanesthetic Checklist Completed: patient identified, IV checked, site marked, risks and benefits discussed, surgical consent, monitors and equipment checked, pre-op evaluation and timeout performed Spinal Block Patient position: sitting Prep: DuraPrep and site prepped and draped Patient monitoring: continuous pulse ox and blood pressure Approach: midline Location: L3-4 Injection technique: single-shot Needle Needle type: Pencan  Needle gauge: 24 G Needle length: 10 cm Needle insertion depth: 7 cm Assessment Sensory level: T4

## 2020-04-03 NOTE — Op Note (Signed)
Cesarean Section Procedure Note  Indications: P5 at 37wks with GHTN and Polyhydramnios presenting for repeat c-section.  Pre-operative Diagnosis: 1.37wks 2.GESTATIONAL HYPERTENSION 3.h/o prior c-section   Post-operative Diagnosis: 1.37wks 2.GHTN 3.h/o prior cesarean section   Procedure: REPEAT CESAREAN SECTION  Surgeon: Osborn Coho, MD    Assistants: Nigel Bridgeman, CNM  Anesthesia: Regional  Anesthesiologist: Leilani Able, MD   Procedure Details  The patient was taken to the operating room secondary to h/o prior c-section after the risks, benefits, complications, treatment options, and expected outcomes were discussed with the patient.  The patient concurred with the proposed plan, giving informed consent which was signed and witnessed. The patient was taken to Operating Room A, identified as [name] and the procedure verified as C-Section Delivery. A Time Out was held and the above information confirmed.  After induction of anesthesia by obtaining a spinal, the patient was prepped and draped in the usual sterile manner. A Pfannenstiel skin incision was made and carried down through the subcutaneous tissue to the underlying layer of fascia.  The fascia was incised bilaterally and extended transversely bilaterally with the Mayo scissors. Kocher clamps were placed on the inferior aspect of the fascial incision and the underlying rectus muscle was separated from the fascia. The same was done on the superior aspect of the fascial incision.  The peritoneum was identified, entered bluntly and extended manually.  An Alexis self-retaining retractor was placed.  The utero-vesical peritoneal reflection was incised transversely and the bladder flap was bluntly freed from the lower uterine segment. A low transverse uterine incision was made with the scalpel and extended bilaterally with the bandage scissors.  The infant was delivered in vertex position without difficulty.  After the  umbilical cord was clamped and cut, the infant was handed to the awaiting pediatricians.  Cord blood was obtained for evaluation.  The placenta was removed intact and appeared to be within normal limits. The uterus was cleared of all clots and debris. The uterine incision was closed with running interlocking sutures of 0 Vicryl and a second imbricating layer was performed as well.   Bilateral tubes and ovaries appeared to be within normal limits.  Good hemostasis was noted.  Copious irrigation was performed until clear.  The peritoneum was repaired with 2-0 chromic via a running suture.  The fascia was reapproximated with a running suture of 0 Vicryl. The subcutaneous tissue was reapproximated with 3 interrupted sutures of 2-0 plain.  The skin was reapproximated with a subcuticular suture of 3-0 monocryl.  Steristrips were applied.  Instrument, sponge, and needle counts were correct prior to abdominal closure and at the conclusion of the case.  The patient was awaiting transfer to the recovery room in good condition.  Findings: Live female infant with Apgars 9 at one minute and 9 at five minutes.  Normal appearing bilateral ovaries and fallopian tubes were noted.  Estimated Blood Loss:  363 ml         Drains: 150 cc foley to gravity clear         Total IV Fluids: 2400 ml         Specimens to Pathology: Placenta         Complications:  None; patient tolerated the procedure well.         Disposition: PACU - hemodynamically stable.         Condition: stable  Attending Attestation: I performed the procedure.

## 2020-04-04 LAB — CBC
HCT: 30.1 % — ABNORMAL LOW (ref 36.0–46.0)
Hemoglobin: 10.3 g/dL — ABNORMAL LOW (ref 12.0–15.0)
MCH: 31 pg (ref 26.0–34.0)
MCHC: 34.2 g/dL (ref 30.0–36.0)
MCV: 90.7 fL (ref 80.0–100.0)
Platelets: 143 10*3/uL — ABNORMAL LOW (ref 150–400)
RBC: 3.32 MIL/uL — ABNORMAL LOW (ref 3.87–5.11)
RDW: 13.8 % (ref 11.5–15.5)
WBC: 11.9 10*3/uL — ABNORMAL HIGH (ref 4.0–10.5)
nRBC: 0 % (ref 0.0–0.2)

## 2020-04-04 MED ORDER — ACETAMINOPHEN 500 MG PO TABS
1000.0000 mg | ORAL_TABLET | Freq: Four times a day (QID) | ORAL | Status: DC
  Administered 2020-04-04 – 2020-04-05 (×5): 1000 mg via ORAL
  Filled 2020-04-04 (×5): qty 2

## 2020-04-04 NOTE — Progress Notes (Signed)
CSW received consult for hx of Anxiety and Post Partum Depression.  CSW met with MOB to offer support and complete assessment.    CSW congratulated MOB on the birth of infant. CSW advised MOB of CSW's role and the reason for CSW coming to visit with her. MOB reported that her anxiety is from years ago and that her PPD was from the birth of her twin girls. MOB reports that she thinks that "my PPD came from being 77 and having three children". CSW validated these feelings and asked MOB if she had PPD after the birth of any other children in which MOB reported that she didn't.MOB reports that she took medication in the oast for her anxiety but reports nothing in the past few years. MOB expressed that she has been feeling well since she gave birth and reported no there needs to this CSW. MOB denies SI and HI as well as DV.   MOB reported that she has all needed items to care for infant as well.    CSW provided education regarding the baby blues period vs. perinatal mood disorders, discussed treatment and gave resources for mental health follow up if concerns arise.  CSW recommends self-evaluation during the postpartum time period using the New Mom Checklist from Postpartum Progress and encouraged MOB to contact a medical professional if symptoms are noted at any time.   CSW provided review of Sudden Infant Death Syndrome (SIDS) precautions.     Virgie Dad Nailyn Dearinger, MSW, LCSW [facility] [place] [phone]

## 2020-04-04 NOTE — Progress Notes (Signed)
Subjective: POD# 1 Live born female  Birth Weight: 7 lb 12.7 oz (3535 g) APGAR: 8, 9  Newborn Delivery   Birth date/time: 04/03/2020 10:57:00 Delivery type: C-Section, Low Transverse Trial of labor: No C-section categorization: Repeat     Baby name: Isabelle-Grace Delivering provider: Osborn Coho   Feeding: bottle  Pain control at delivery: Spinal   Reports feeling sore when laying down, feels better to bu up and sitting.   Patient reports tolerating PO.   Breast symptoms:none Pain controlled with PO meds Denies HA/SOB/C/P/N/V/dizziness. Flatus absent. She reports vaginal bleeding as normal, without clots.  She is ambulating, urinating without difficulty.     Objective:   VS:    Vitals:   04/03/20 2200 04/04/20 0111 04/04/20 0320 04/04/20 0525  BP:  130/80  122/68  Pulse:  61  (!) 59  Resp:  18 20 18   Temp: 98.4 F (36.9 C) 98.1 F (36.7 C)  98.5 F (36.9 C)  TempSrc: Oral Oral  Oral  SpO2:  98% 97% 98%  Weight:      Height:          Intake/Output Summary (Last 24 hours) at 04/04/2020 0849 Last data filed at 04/04/2020 0800 Gross per 24 hour  Intake 5078.75 ml  Output 2138 ml  Net 2940.75 ml        Recent Labs    04/01/20 0900 04/04/20 0635  WBC 9.5 11.9*  HGB 11.5* 10.3*  HCT 34.5* 30.1*  PLT 158 143*     Blood type: --/--/A POS (08/02 0900)  Rubella: Immune (05/07 0000)  Vaccines: TDaP UTD         Flu    NA   Physical Exam:  General: alert, cooperative and no distress CV: Regular rate and rhythm Resp: clear Abdomen: soft, nontender, decreased bowel sounds Incision: intact and serous drainage present Uterine Fundus: firm, below umbilicus, nontender Lochia: minimal Ext: +1 pedal edema, no redness or tenderness in the calves or thighs      Assessment/Plan: 34 y.o.   POD# 1. 20                  Principal Problem:   Postpartum care following cesarean delivery 8/4 Active Problems:   Hepatitis C antibody test positive   H/O  cesarean section   Gestational hypertension   Chronic headaches   Doing well, stable.    BP normotensive, no neural sx           Advance diet as tolerated Encourage rest when baby rests Encourage to ambulate Routine post-op care  10/4, CNM, MSN 04/04/2020, 8:49 AM

## 2020-04-05 MED ORDER — IBUPROFEN 800 MG PO TABS: 800.0000 mg | ORAL_TABLET | Freq: Four times a day (QID) | ORAL | 0 refills | Status: DC

## 2020-04-05 MED ORDER — SIMETHICONE 80 MG PO CHEW: 80.0000 mg | CHEWABLE_TABLET | ORAL | 0 refills | Status: DC | PRN

## 2020-04-05 MED ORDER — OXYCODONE HCL 5 MG PO TABS: 5.0000 mg | ORAL_TABLET | ORAL | 0 refills | Status: DC | PRN

## 2020-04-05 MED ORDER — ACETAMINOPHEN 500 MG PO TABS: 1000.0000 mg | ORAL_TABLET | Freq: Four times a day (QID) | ORAL | 0 refills | Status: DC

## 2020-04-05 MED ORDER — COCONUT OIL OIL: 1.0000 "application " | TOPICAL_OIL | 0 refills | Status: DC | PRN

## 2020-04-05 MED ORDER — SENNOSIDES-DOCUSATE SODIUM 8.6-50 MG PO TABS: 2.0000 | ORAL_TABLET | ORAL | Status: DC

## 2020-04-05 NOTE — Discharge Summary (Signed)
OB Discharge Summary  Patient Name: [name] DOB: 19-Sep-1985 MRN: [id number]  Date of admission: 04/03/2020 Delivering provider: Osborn Coho   Admitting diagnosis: Status post repeat low transverse cesarean section [Z98.891] Encounter for maternal care for scar from repeat cesarean delivery [O34.219] Intrauterine pregnancy: [redacted]w[redacted]d     Secondary diagnosis: Patient Active Problem List   Diagnosis Date Noted  . Postpartum care following cesarean delivery 8/4 04/04/2020  . Status post repeat low transverse cesarean section 04/03/2020  . Encounter for maternal care for scar from repeat cesarean delivery 04/03/2020  . Gestational hypertension 03/29/2020  . History of premature delivery--x 2 03/29/2020  . Late prenatal care--started at 24 weeks 03/29/2020  . Chronic headaches 03/29/2020  . 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 Severe obesity (BMI >= 40) ([facility]) 02/17/2014  . Hypertensive disorder--on med prior to pregnancy 02/17/2014  . H/O shoulder dystocia in prior pregnancy, currently pregnant 02/17/2014  . H/O cesarean section 02/17/2014  . LGA (large for gestational age) fetus affecting management of mother--hx of  02/17/2014  . Hepatitis C antibody test positive 03/04/2012   Additional problems:none   Date of discharge: 04/05/2020   Discharge diagnosis: Principal Problem:   Postpartum care following cesarean delivery 8/4 Active Problems:   Hepatitis C antibody test positive   H/O cesarean section   Gestational hypertension   Chronic headaches                                                              Post partum procedures:none  Augmentation: N/A Pain control: Spinal  Laceration:None  Episiotomy:None  Complications: None  Hospital course:  Sceduled C/S   34 y.o. yo G5P2205 at [redacted]w[redacted]d was admitted to the hospital 04/03/2020 for scheduled cesarean section with the following indication:Elective Repeat.Delivery details are as follows:  Membrane Rupture  Time/Date: 10:56 AM ,04/03/2020   Delivery Method:C-Section, Low Transverse  Details of operation can be found in separate operative note.  Patient had an uncomplicated postpartum course.  She is ambulating, tolerating a regular diet, passing flatus, and urinating well. Patient is discharged home in stable condition on  04/05/20        Newborn Data: Birth date:04/03/2020  Birth time:10:57 AM  Gender:Female  Living status:Living  Apgars:8 ,9  Weight:3535 g     Physical exam  Vitals:   04/04/20 0525 04/04/20 1446 04/04/20 2100 04/05/20 0634  BP: 122/68 131/84 137/75 139/86  Pulse: (!) 59 71 66 65  Resp: 18 18 18 16   Temp: 98.5 F (36.9 C) 98 F (36.7 C) 98.5 F (36.9 C)   TempSrc: Oral Oral Oral   SpO2: 98%  97% 99%  Weight:      Height:       General: alert, cooperative and no distress Lochia: appropriate Uterine Fundus: firm Incision: incision well approximated, serous drainage yesterday, dressing wet from shower today, will change DVT Evaluation: No cords or calf tenderness. No significant calf/ankle edema. Labs: Lab Results  Component Value Date   WBC 11.9 (H) 04/04/2020   HGB 10.3 (L) 04/04/2020   HCT 30.1 (L) 04/04/2020   MCV 90.7 04/04/2020   PLT 143 (L) 04/04/2020   CMP Latest Ref Rng & Units 04/01/2020  Glucose 70 - 99 mg/dL 87  BUN 6 - 20 mg/dL 7  Creatinine 0.44 - 1.00 mg/dL 1.61  Sodium 096 - 045 mmol/L 137  Potassium 3.5 - 5.1 mmol/L 3.6  Chloride 98 - 111 mmol/L 108  CO2 22 - 32 mmol/L 18(L)  Calcium 8.9 - 10.3 mg/dL 4.0(J)  Total Protein 6.5 - 8.1 g/dL 5.9(L)  Total Bilirubin 0.3 - 1.2 mg/dL 0.5  Alkaline Phos 38 - 126 U/L 66  AST 15 - 41 U/L 13(L)  ALT 0 - 44 U/L 9   Edinburgh Postnatal Depression Scale Screening Tool 04/05/2020 04/04/2020  I have been able to laugh and see the funny side of things. 0 (No Data)  I have looked forward with enjoyment to things. 0 -  I have blamed myself unnecessarily when things went wrong. 0 -  I have been anxious or  worried for no good reason. 0 -  I have felt scared or panicky for no good reason. 0 -  Things have been getting on top of me. 0 -  I have been so unhappy that I have had difficulty sleeping. 0 -  I have felt sad or miserable. 0 -  I have been so unhappy that I have been crying. 0 -  The thought of harming myself has occurred to me. 0 -  Edinburgh Postnatal Depression Scale Total 0 -   Vaccines: TDaP UTD         Flu    NA  Discharge instruction:  per After Visit Summary,  [facility] OB booklet and  "Understanding Mother & Baby Care" hospital booklet  After Visit Meds:  Allergies as of 04/05/2020      Reactions   Penicillins Other (See Comments)   Mother endorsed reaction as a child.      Medication List    TAKE these medications   acetaminophen 500 MG tablet Commonly known as: TYLENOL Take 1,000 mg by mouth every 6 (six) hours as needed for moderate pain. What changed: Another medication with the same name was added. Make sure you understand how and when to take each.   acetaminophen 500 MG tablet Commonly known as: TYLENOL Take 2 tablets (1,000 mg total) by mouth every 6 (six) hours. What changed: You were already taking a medication with the same name, and this prescription was added. Make sure you understand how and when to take each.   butalbital-acetaminophen-caffeine 50-325-40 MG tablet Commonly known as: FIORICET Take 1 tablet by mouth 2 (two) times daily as needed for headache.   coconut oil Oil Apply 1 application topically as needed.   cyclobenzaprine 10 MG tablet Commonly known as: FLEXERIL Take 10 mg by mouth 3 (three) times daily as needed for muscle spasms.   ibuprofen 800 MG tablet Commonly known as: ADVIL Take 1 tablet (800 mg total) by mouth every 6 (six) hours.   oxyCODONE 5 MG immediate release tablet Commonly known as: Oxy IR/ROXICODONE Take 1-2 tablets (5-10 mg total) by mouth every 4 (four) hours as needed for moderate pain.   PRENATAL VITAMIN  PO Take 1 tablet by mouth daily.   senna-docusate 8.6-50 MG tablet Commonly known as: Senokot-S Take 2 tablets by mouth daily. Start taking on: April 06, 2020   simethicone 80 MG chewable tablet Commonly known as: MYLICON Chew 1 tablet (80 mg total) by mouth as needed for flatulence.       Diet: routine diet  Activity: Advance as tolerated. Pelvic rest for 6 weeks.   Postpartum contraception: Not Discussed  Newborn Data: Live born female  Birth Weight: 7 lb  12.7 oz (3535 g) APGAR: 8, 9  Newborn Delivery   Birth date/time: 04/03/2020 10:57:00 Delivery type: C-Section, Low Transverse Trial of labor: No C-section categorization: Repeat      named Isabelle-Grace Baby Feeding: Bottle Disposition:home with mother pending bili results and weight check this PM   Delivery Report:  Review the Delivery Report for details.    Follow up:  Follow-up Information    [facility]. Schedule an appointment as soon as possible for a visit in 6 week(s).   Specialty: Obstetrics and Gynecology Contact information: [address] [place] [address] [phone]                Signed: Cipriano Mile, MSN 04/05/2020, 11:29 AM

## 2021-07-07 ENCOUNTER — Other Ambulatory Visit: Payer: Self-pay | Admitting: Adult Medicine

## 2021-07-07 DIAGNOSIS — G8929 Other chronic pain: Secondary | ICD-10-CM

## 2021-12-14 ENCOUNTER — Encounter (HOSPITAL_COMMUNITY): Payer: Self-pay

## 2021-12-14 ENCOUNTER — Emergency Department (HOSPITAL_COMMUNITY): Payer: Medicaid Other

## 2021-12-14 ENCOUNTER — Observation Stay (HOSPITAL_COMMUNITY): Payer: Medicaid Other

## 2021-12-14 ENCOUNTER — Other Ambulatory Visit: Payer: Self-pay

## 2021-12-14 ENCOUNTER — Observation Stay (HOSPITAL_COMMUNITY)
Admission: EM | Admit: 2021-12-14 | Discharge: 2021-12-15 | Disposition: A | Discharge status: Home / Self Care | Payer: Medicaid Other | Attending: Family Medicine | Admitting: Family Medicine

## 2021-12-14 DIAGNOSIS — L02611 Cutaneous abscess of right foot: Secondary | ICD-10-CM

## 2021-12-14 DIAGNOSIS — M79671 Pain in right foot: Secondary | ICD-10-CM | POA: Diagnosis present

## 2021-12-14 DIAGNOSIS — Z8659 Personal history of other mental and behavioral disorders: Secondary | ICD-10-CM | POA: Insufficient documentation

## 2021-12-14 DIAGNOSIS — I10 Essential (primary) hypertension: Secondary | ICD-10-CM | POA: Diagnosis not present

## 2021-12-14 DIAGNOSIS — Z79899 Other long term (current) drug therapy: Secondary | ICD-10-CM | POA: Diagnosis not present

## 2021-12-14 DIAGNOSIS — Z87891 Personal history of nicotine dependence: Secondary | ICD-10-CM | POA: Insufficient documentation

## 2021-12-14 DIAGNOSIS — G8929 Other chronic pain: Secondary | ICD-10-CM | POA: Insufficient documentation

## 2021-12-14 DIAGNOSIS — M549 Dorsalgia, unspecified: Secondary | ICD-10-CM | POA: Diagnosis not present

## 2021-12-14 DIAGNOSIS — M71871 Other specified bursopathies, right ankle and foot: Principal | ICD-10-CM | POA: Insufficient documentation

## 2021-12-14 HISTORY — DX: Cutaneous abscess of right foot: L02.611

## 2021-12-14 LAB — COMPREHENSIVE METABOLIC PANEL
ALT: 10 U/L (ref 0–44)
AST: 15 U/L (ref 15–41)
Albumin: 3.7 g/dL (ref 3.5–5.0)
Alkaline Phosphatase: 53 U/L (ref 38–126)
Anion gap: 8 (ref 5–15)
BUN: 10 mg/dL (ref 6–20)
CO2: 24 mmol/L (ref 22–32)
Calcium: 9.1 mg/dL (ref 8.9–10.3)
Chloride: 106 mmol/L (ref 98–111)
Creatinine, Ser: 0.75 mg/dL (ref 0.44–1.00)
GFR, Estimated: >60 mL/min (ref >60)
Glucose, Bld: 93 mg/dL (ref 70–99)
Potassium: 3.7 mmol/L (ref 3.5–5.1)
Sodium: 138 mmol/L (ref 135–145)
Total Bilirubin: 0.4 mg/dL (ref 0.3–1.2)
Total Protein: 6.7 g/dL (ref 6.5–8.1)

## 2021-12-14 LAB — CBC WITH DIFFERENTIAL/PLATELET
Abs Immature Granulocytes: 0.05 10*3/uL (ref 0.00–0.07)
Basophils Absolute: 0.1 10*3/uL (ref 0.0–0.1)
Basophils Relative: 1 %
Eosinophils Absolute: 0.4 10*3/uL (ref 0.0–0.5)
Eosinophils Relative: 4 %
HCT: 35.8 % — ABNORMAL LOW (ref 36.0–46.0)
Hemoglobin: 10.5 g/dL — ABNORMAL LOW (ref 12.0–15.0)
Immature Granulocytes: 1 %
Lymphocytes Relative: 29 %
Lymphs Abs: 2.9 10*3/uL (ref 0.7–4.0)
MCH: 20.2 pg — ABNORMAL LOW (ref 26.0–34.0)
MCHC: 29.3 g/dL — ABNORMAL LOW (ref 30.0–36.0)
MCV: 69 fL — ABNORMAL LOW (ref 80.0–100.0)
Monocytes Absolute: 0.8 10*3/uL (ref 0.1–1.0)
Monocytes Relative: 8 %
Neutro Abs: 5.7 10*3/uL (ref 1.7–7.7)
Neutrophils Relative %: 57 %
Platelets: 373 10*3/uL (ref 150–400)
RBC: 5.19 MIL/uL — ABNORMAL HIGH (ref 3.87–5.11)
RDW: 20.8 % — ABNORMAL HIGH (ref 11.5–15.5)
Smear Review: ADEQUATE
WBC: 10 10*3/uL (ref 4.0–10.5)
nRBC: 0 % (ref 0.0–0.2)

## 2021-12-14 LAB — C-REACTIVE PROTEIN: CRP: 1 mg/dL — ABNORMAL HIGH (ref <1.0)

## 2021-12-14 LAB — SEDIMENTATION RATE: Sed Rate: 4 mm/hr (ref 0–22)

## 2021-12-14 MED ORDER — KETOROLAC TROMETHAMINE 30 MG/ML IJ SOLN
30.0000 mg | Freq: Once | INTRAMUSCULAR | Status: AC
  Administered 2021-12-14: 30 mg via INTRAVENOUS
  Filled 2021-12-14: qty 1

## 2021-12-14 MED ORDER — GADOBUTROL 1 MMOL/ML IV SOLN
10.0000 mL | Freq: Once | INTRAVENOUS | Status: AC | PRN
  Administered 2021-12-14: 10 mL via INTRAVENOUS

## 2021-12-14 MED ORDER — NICOTINE 21 MG/24HR TD PT24
21.0000 mg | MEDICATED_PATCH | Freq: Every day | TRANSDERMAL | Status: DC
  Administered 2021-12-14 – 2021-12-15 (×2): 21 mg via TRANSDERMAL
  Filled 2021-12-14 (×2): qty 1

## 2021-12-14 MED ORDER — OXYCODONE HCL 5 MG PO TABS
10.0000 mg | ORAL_TABLET | Freq: Once | ORAL | Status: AC
  Administered 2021-12-14: 10 mg via ORAL
  Filled 2021-12-14: qty 2

## 2021-12-14 MED ORDER — OXYCODONE HCL 5 MG PO TABS
5.0000 mg | ORAL_TABLET | Freq: Once | ORAL | Status: AC
  Administered 2021-12-14: 5 mg via ORAL
  Filled 2021-12-14: qty 1

## 2021-12-14 MED ORDER — HYDROCODONE-ACETAMINOPHEN 5-325 MG PO TABS
1.0000 | ORAL_TABLET | Freq: Once | ORAL | Status: AC
  Administered 2021-12-14: 1 via ORAL
  Filled 2021-12-14: qty 1

## 2021-12-14 MED ORDER — OXYCODONE HCL 5 MG PO TABS
5.0000 mg | ORAL_TABLET | Freq: Three times a day (TID) | ORAL | Status: DC | PRN
  Administered 2021-12-14: 5 mg via ORAL
  Filled 2021-12-14 (×2): qty 1

## 2021-12-14 MED ORDER — ACETAMINOPHEN 325 MG PO TABS
650.0000 mg | ORAL_TABLET | Freq: Four times a day (QID) | ORAL | Status: DC
  Administered 2021-12-14 – 2021-12-15 (×5): 650 mg via ORAL
  Filled 2021-12-14 (×5): qty 2

## 2021-12-14 MED ORDER — OXYCODONE HCL 5 MG PO TABS
5.0000 mg | ORAL_TABLET | Freq: Three times a day (TID) | ORAL | Status: DC | PRN
  Administered 2021-12-15: 5 mg via ORAL
  Filled 2021-12-14: qty 1

## 2021-12-14 MED ORDER — LORAZEPAM 2 MG/ML IJ SOLN: 0.5000 mg | Freq: Once | INTRAMUSCULAR | Status: DC | PRN

## 2021-12-14 MED ORDER — SODIUM CHLORIDE 0.9 % IV SOLN
100.0000 mg | Freq: Two times a day (BID) | INTRAVENOUS | Status: DC
  Administered 2021-12-14 – 2021-12-15 (×2): 100 mg via INTRAVENOUS
  Filled 2021-12-14 (×2): qty 100

## 2021-12-14 NOTE — ED Provider Notes (Signed)
With no ?[facility] EMERGENCY DEPARTMENT ?Provider Note ? ? ?CSN: [id number] ?Arrival date & time: 12/14/21  0008 ? ?  ? ?History ? ?Chief Complaint  ?Patient presents with  ? Foot Pain  ? ? ?[name] is a 36 y.o. female.  Patient presents to the emergency department complaining of right foot pain with swelling and redness.  Patient states that approximately 3 days ago she noticed a spot on her foot that she believes may be a developing plantar wart.  Over the past 2 days pain has increased to the point where the entire foot is very tender.  The foot is swollen when compared to her left foot.  There is redness that is starting to streak up the ankle. No relevant PMH ? ?. ?  ? ?Home Medications ?Prior to Admission medications   ?Medication Sig Start Date End Date Taking? Authorizing Provider  ?acetaminophen (TYLENOL) 500 MG tablet Take 1,000 mg by mouth every 6 (six) hours as needed for moderate pain.    [provider]  ?acetaminophen (TYLENOL) 500 MG tablet Take 2 tablets (1,000 mg total) by mouth every 6 (six) hours. 04/05/20   Neta Mends, CNM  ?butalbital-acetaminophen-caffeine (FIORICET) 50-325-40 MG tablet Take 1 tablet by mouth 2 (two) times daily as needed for headache.    [provider]  ?coconut oil OIL Apply 1 application topically as needed. 04/05/20   Neta Mends, CNM  ?cyclobenzaprine (FLEXERIL) 10 MG tablet Take 10 mg by mouth 3 (three) times daily as needed for muscle spasms.    [provider]  ?ibuprofen (ADVIL) 800 MG tablet Take 1 tablet (800 mg total) by mouth every 6 (six) hours. 04/05/20   Neta Mends, CNM  ?oxyCODONE (OXY IR/ROXICODONE) 5 MG immediate release tablet Take 1-2 tablets (5-10 mg total) by mouth every 4 (four) hours as needed for moderate pain. 04/05/20   Neta Mends, CNM  ?Prenatal Vit-Fe Fumarate-FA (PRENATAL VITAMIN PO) Take 1 tablet by mouth daily.    [provider]  ?senna-docusate (SENOKOT-S) 8.6-50 MG  tablet Take 2 tablets by mouth daily. 04/06/20   Neta Mends, CNM  ?simethicone (MYLICON) 80 MG chewable tablet Chew 1 tablet (80 mg total) by mouth as needed for flatulence. 04/05/20   Neta Mends, CNM  ?labetalol (NORMODYNE) 200 MG tablet Take 400 mg by mouth 2 (two) times daily.   11/27/14  [provider]  ?   ? ?Allergies    ?Penicillins   ? ?Review of Systems   ?Review of Systems  ?Constitutional:  Negative for fever.  ?Respiratory:  Negative for shortness of breath.   ?Gastrointestinal:  Negative for abdominal pain.  ?Genitourinary:  Negative for dysuria.  ?Musculoskeletal:   ?     Right foot pain, swelling, redness  ? ?Physical Exam ?Updated Vital Signs ?BP (!) 153/104   Pulse 65   Temp 98.3 ?F (36.8 ?C)   Resp 17   Ht 5\' 6"  (1.676 m)   Wt 104.3 kg   SpO2 96%   BMI 37.12 kg/m?  ?Physical Exam ?Vitals and nursing note reviewed.  ?Constitutional:   ?   Comments: Patient appears to be in pain  ?HENT:  ?   Head: Normocephalic.  ?Eyes:  ?   Conjunctiva/sclera: Conjunctivae normal.  ?Cardiovascular:  ?   Rate and Rhythm: Normal rate and regular rhythm.  ?   Pulses: Normal pulses.  ?Pulmonary:  ?   Effort: Pulmonary effort is normal.  ?  Musculoskeletal:     ?   General: Swelling and tenderness present.  ?   Cervical back: Normal range of motion.  ?   Comments: Right foot is swollen when compared to left. Tender to palpation over entire foot. Erythema noted with streaking up the ankle. Small apparent blister noted on sole of right foot  ?Skin: ?   Findings: Erythema present.  ?Neurological:  ?   Mental Status: She is alert and oriented to person, place, and time.  ? ? ?ED Results / Procedures / Treatments   ?Labs ?(all labs ordered are listed, but only abnormal results are displayed) ?Labs Reviewed  ?CBC WITH DIFFERENTIAL/PLATELET - Abnormal; Notable for the following components:  ?    Result Value  ? RBC 5.19 (*)   ? Hemoglobin 10.5 (*)   ? HCT 35.8 (*)   ? MCV 69.0 (*)   ? MCH 20.2 (*)   ? MCHC  29.3 (*)   ? RDW 20.8 (*)   ? All other components within normal limits  ?C-REACTIVE PROTEIN - Abnormal; Notable for the following components:  ? CRP 1.0 (*)   ? All other components within normal limits  ?COMPREHENSIVE METABOLIC PANEL  ?SEDIMENTATION RATE  ? ? ?EKG ?None ? ?Radiology ?DG Foot Complete Right ? ?Result Date: 12/14/2021 ?CLINICAL DATA:  swollen, painful to touch EXAM: RIGHT FOOT COMPLETE - 3+ VIEW COMPARISON:  None. FINDINGS: No cortical erosion or destruction. There is no evidence of fracture or dislocation. Posterior calcaneal spur. There is no evidence of arthropathy or other focal bone abnormality. Subcutaneus soft tissue edema of the lateral forefoot. No retained radiopaque foreign body. IMPRESSION: 1. Subcutaneus soft tissue edema of the lateral forefoot. No retained radiopaque foreign body. 2. No radiographic findings suggest osteomyelitis. 3.  No acute displaced fracture or dislocation. Electronically Signed   By: Tish Frederickson M.D.   On: 12/14/2021 02:02   ? ?Procedures ?Procedures  ? ? ?Medications Ordered in ED ?Medications  ?LORazepam (ATIVAN) injection 0.5 mg (has no administration in time range)  ?HYDROcodone-acetaminophen (NORCO/VICODIN) 5-325 MG per tablet 1 tablet (1 tablet Oral Given 12/14/21 0920)  ?oxyCODONE (Oxy IR/ROXICODONE) immediate release tablet 10 mg (10 mg Oral Given 12/14/21 1022)  ? ? ?ED Course/ Medical Decision Making/ A&P ?  ?                        ?Medical Decision Making ?Amount and/or Complexity of Data Reviewed ?Labs: ordered. ?Radiology: ordered. ? ?Risk ?Prescription drug management. ?Decision regarding hospitalization. ? ? ?The patient presents with a chief complaint of right foot pain. Differential includes but is not limited to fracture, sprain, cellulitis, and others.  ? ?I ordered and interpreted labs. Pertinent results include: CRP 1.0, Hgb 10.5 (consistent with previous results) ? ?I personally ordered, viewed, and interpreted imaging including plain  radiographs of the right foot.  Subcutaneous soft tissue edema of the lateral forefoot retained radiopaque foreign body.  No radiographic findings suggesting osteomyelitis.  No acute displaced fracture or dislocation.  I agree with the radiologist findings ?Bedside ultrasound performed by Dr. Durwin Nora showed fluid collection under the apparent blister along with an additional small fluid collection deep to the blister. ? ?I ordered hydrocodone and Percocet for pain.  Upon reassessment the pain had slightly improved.  I also reviewed patient's home medications. ? ?I discussed the case with Dr. Allena Katz, podiatry, who agreed to consult on the patient. Discussed possible I and D in the emergency department  vs under anesthesia in the OR. Patient does not seem amenable to attempt in ED. Dr. Allena KatzPatel requested MRI of the foot and medicine admission.  I discussed with medicine who agreed to admit. ? ?Final Clinical Impression(s) / ED Diagnoses ?Final diagnoses:  ?Foot pain, right  ? ? ?Rx / DC Orders ?ED Discharge Orders   ? ? None  ? ?  ? ? ?  ?Darrick GrinderMcCauley, Vicktoria Muckey B, PA-C ?12/14/21 1309 ? ?  ?Gloris Manchesterixon, Ryan, MD ?12/15/21 1901 ? ?
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
## 2021-12-14 NOTE — Consult Note (Signed)
?  Subjective:  ?Patient ID: [name], female    DOB: [birth year],  MRN: [id number] ? ?A 36 y.o. female presents with right foot abscess plantar.  She states been developing quite some time.  She denies being a diabetic.  She is having excruciating pain.  She states it progressed leave the start for the last 3 days ago and has gotten worse.  There is some redness around it.  She has not seen anyone else prior to seeing me for this.  It is painful to touch painful to ambulate on. ?Objective:  ? ?Vitals:  ? 12/14/21 0618 12/14/21 1132  ?BP: (!) 156/95 (!) 153/104  ?Pulse: 62 65  ?Resp: 18 17  ?Temp:    ?SpO2: 97% 96%  ? ?General AA&O x3. Normal mood and affect.  ?Vascular Dorsalis pedis and posterior tibial pulses 2/4 bilat. ?Brisk capillary refill to all digits. Pedal hair present.  ?Neurologic Epicritic sensation grossly intact.  ?Dermatologic Right plantar foot abscess area of fluctuance noted.  Surrounding cellulitis/erythema noted.  No purulent drainage was expressed.  Pain on palpation to the abscess.  ?Orthopedic: MMT 5/5 in dorsiflexion, plantarflexion, inversion, and eversion. ?Normal joint ROM without pain or crepitus.  ? ? ?Assessment & Plan:  ?Patient was evaluated and treated and all questions answered. ? ?Right plantar foot abscess ?-All questions and concerns were discussed at bedside. ?-I discussed with the patient that she will benefit from right foot incision drainage washout debridement of the abscess.  We will order an MRI to evaluate the depth of the abscess. ?-Broad-spectrum IV antibiotics and can narrow down based on culture ?-Forefoot weightbearing to the right lower extremity ?-N.p.o. after midnight ?-Plan for surgical intervention tomorrow in the afternoon with Dr. Blenda Mounts.  Case discussed with Dr. Blenda Mounts. ? ? ?Felipa Furnace, DPM ? ?Accessible via secure chat for questions or concerns. ? ?

## 2021-12-14 NOTE — Hospital Course (Addendum)
[name] is a 36 y.o. female presenting with right foot pain concerning for abscess x 3 days . PMH is significant for HSV-1, obesity, chronic headaches.  ? ?Right Foot Pain ?Patient presented with 3 days of R foot pain. X-ray showed soft tissue edema. Podiatry consulted due to concern for abscess and recommended MRI with possible subsequent I&D. Patient initially treated with IV doxycycline. A MRI of the right foot was negative for fluid collection but did note a cystic lesion at the 4th metatarsal head (likely bursa or ganglion cyst). Patient was discharged on p.o. Keflex for a total of 5 days, ibuprofen for pain control, and with close podiatry follow-up.  Plan is to follow-up with podiatry on or around 4/24.  Patient remained hemodynamically stable for the duration of the hospitalization. ? ? ?Issues for follow-up: ?Ensure podiatry follow-up ?Closely monitor for signs of infection or fluid collection ?

## 2021-12-14 NOTE — ED Notes (Signed)
Pt partner came up to me asking about wait and if she cane get some pain meds I informed them that I notify the nurse.  ?

## 2021-12-14 NOTE — ED Notes (Signed)
Pt wondering about IV abx and IV pain meds - Pt reporting pain, not quite time for PRN pain meds - This RN spoke to MD Jena Gauss who is placing order for IV abx and oxycodone one time for breakthrough pain  ?

## 2021-12-14 NOTE — ED Triage Notes (Addendum)
Pt arrived POV from home c/o right foot pain. Pt states her foot started hurting 3 days ago and has gotten progressively worse. Now the pain shoots up her leg and her foot is swollen, red and painful to touch. Pt denies any trauma or injury ?

## 2021-12-14 NOTE — ED Notes (Signed)
Pt states that the oxycodone is not helping her pain - MD paged and asked if there is anything else we can try  ? ?

## 2021-12-15 ENCOUNTER — Other Ambulatory Visit (HOSPITAL_COMMUNITY): Payer: Self-pay

## 2021-12-15 DIAGNOSIS — M79671 Pain in right foot: Secondary | ICD-10-CM | POA: Diagnosis not present

## 2021-12-15 LAB — CBC
HCT: 36.2 % (ref 36.0–46.0)
Hemoglobin: 10.4 g/dL — ABNORMAL LOW (ref 12.0–15.0)
MCH: 20.1 pg — ABNORMAL LOW (ref 26.0–34.0)
MCHC: 28.7 g/dL — ABNORMAL LOW (ref 30.0–36.0)
MCV: 70 fL — ABNORMAL LOW (ref 80.0–100.0)
Platelets: 335 10*3/uL (ref 150–400)
RBC: 5.17 MIL/uL — ABNORMAL HIGH (ref 3.87–5.11)
RDW: 21 % — ABNORMAL HIGH (ref 11.5–15.5)
WBC: 9.2 10*3/uL (ref 4.0–10.5)
nRBC: 0 % (ref 0.0–0.2)

## 2021-12-15 LAB — BASIC METABOLIC PANEL
Anion gap: 7 (ref 5–15)
BUN: 20 mg/dL (ref 6–20)
CO2: 22 mmol/L (ref 22–32)
Calcium: 9.1 mg/dL (ref 8.9–10.3)
Chloride: 111 mmol/L (ref 98–111)
Creatinine, Ser: 0.85 mg/dL (ref 0.44–1.00)
GFR, Estimated: >60 mL/min (ref >60)
Glucose, Bld: 116 mg/dL — ABNORMAL HIGH (ref 70–99)
Potassium: 4.2 mmol/L (ref 3.5–5.1)
Sodium: 140 mmol/L (ref 135–145)

## 2021-12-15 LAB — HIV ANTIBODY (ROUTINE TESTING W REFLEX): HIV Screen 4th Generation wRfx: NONREACTIVE

## 2021-12-15 SURGERY — INCISION AND DRAINAGE
Anesthesia: General | Laterality: Right

## 2021-12-15 MED ORDER — CEPHALEXIN 500 MG PO CAPS
500.0000 mg | ORAL_CAPSULE | Freq: Three times a day (TID) | ORAL | 0 refills | Status: AC
  Filled 2021-12-15: qty 15, 5d supply, fill #0

## 2021-12-15 MED ORDER — IBUPROFEN 200 MG PO TABS
600.0000 mg | ORAL_TABLET | Freq: Four times a day (QID) | ORAL | Status: DC | PRN
Start: 2021-12-15 — End: 2023-03-12

## 2021-12-15 MED ORDER — OXYCODONE HCL 5 MG PO TABS
10.0000 mg | ORAL_TABLET | Freq: Once | ORAL | Status: AC
  Administered 2021-12-15: 10 mg via ORAL
  Filled 2021-12-15: qty 2

## 2021-12-15 MED ORDER — KETOROLAC TROMETHAMINE 15 MG/ML IJ SOLN
15.0000 mg | Freq: Four times a day (QID) | INTRAMUSCULAR | Status: DC | PRN
  Administered 2021-12-15: 15 mg via INTRAVENOUS
  Filled 2021-12-15: qty 1

## 2021-12-15 NOTE — ED Notes (Signed)
Pt ambulatory to RR without incident.  ?

## 2021-12-15 NOTE — ED Notes (Signed)
Pt resting at this time after not being able to sleep for days per pt - will hold scheduled tylenol at this time - will obtain vitals when pt is awake - No acute distress noted at this time, chest rise and fall visualized  ?

## 2021-12-15 NOTE — ED Notes (Signed)
Pt verbalizes understanding of discharge instructions. Opportunity for questions and answers were provided. Pt discharged from the ED.   ?

## 2021-12-15 NOTE — ED Notes (Signed)
Pt wheeled to bathroom by husband and back to bed  ?

## 2021-12-15 NOTE — Plan of Care (Signed)
MRI reviewed and no abscess noted and reported that patient has improved pain today. Will discontinue plan for surgery and have patient follow-up outpatient in our clinic.  ?

## 2021-12-15 NOTE — Progress Notes (Signed)
FPTS Brief Progress Note ? ?S: Patient sleeping in hallway bed in ED.  I did not wake the patient. ? ? ?O: ?BP (!) 155/84   Pulse 76   Temp 98.3 ?F (36.8 ?C)   Resp 18   Ht 5\' 6"  (1.676 m)   Wt 104.3 kg   SpO2 99%   BMI 37.12 kg/m?   ?General: No acute distress ?Respiratory: Normal work of breathing ? ?A/P: ?Patient has had difficulty with pain control, have given 1 dose of Toradol and an extra dose of the oxycodone in addition to as needed dosing that had already previously been ordered.  We will continue to monitor throughout the night.  Vital signs of been stable and updated imaging does not show concern for osteomyelitis.  Continue IV doxycycline. ?- Orders reviewed. Labs for AM ordered, which was adjusted as needed.  ? ? , MD ?12/15/2021, 12:12 AM ?PGY-1, [facility] Night Resident  ?Please page [phone] with questions.  ? ? ?

## 2021-12-15 NOTE — Discharge Summary (Signed)
Family Medicine Teaching Service ?Hospital Discharge Summary ? ?Patient name: [name] Medical record number: [id number] ?Date of birth: [birth year] Age: 36 y.o. Gender: female ?Date of Admission: 12/14/2021  Date of Discharge: 12/15/2021 ?Admitting Physician: Shary Key, DO ? ?Primary Care Provider: Everett Graff, MD ?Consultants: Podiatry ? ?Indication for Hospitalization: R foot pain and swelling ? ?Discharge Diagnoses/Problem List:  ?R foot soft tissue infection ?Adventitial bursitis ? ?Disposition: Home ? ?Discharge Condition: Stable ? ?Discharge Exam:  ?From my same day progress note: ?General: Sleeping on arrival, awakens easily to voice ?Cardiovascular: RRR, no murmur, rub, gallop ?Respiratory: Normal WOB on RA, lungs clear throughout ?Abdomen: Soft and non-tender, non-distended ?Extremities: R foot with what appears to be a plantar wart, no surrounding erythema though the surrounding area is quite tender to palpation. No streaking or warmth appreciated ? ?Brief Hospital Course:  ?[name] is a 36 y.o. female presenting with right foot pain concerning for abscess x 3 days . PMH is significant for HSV-1, obesity, chronic headaches.  ? ?Right Foot Pain ?Patient presented with 3 days of R foot pain. X-ray showed soft tissue edema. Podiatry consulted due to concern for abscess and recommended MRI with possible subsequent I&D. Patient initially treated with IV doxycycline. A MRI of the right foot was negative for fluid collection but did note a cystic lesion at the 4th metatarsal head (likely bursa or ganglion cyst). Patient was discharged on p.o. Keflex for a total of 5 days, ibuprofen for pain control, and with close podiatry follow-up.  Plan is to follow-up with podiatry on or around 4/24.  Patient remained hemodynamically stable for the duration of the hospitalization. ? ? ?Issues for follow-up: ?Ensure podiatry follow-up ?Closely monitor for signs of infection or fluid  collection ? ?Significant Procedures: None ? ?Significant Labs and Imaging:  ?Recent Labs  ?Lab 12/14/21 ?0930 12/15/21 ?0406  ?WBC 10.0 9.2  ?HGB 10.5* 10.4*  ?HCT 35.8* 36.2  ?PLT 373 335  ? ?Recent Labs  ?Lab 12/14/21 ?0930 12/15/21 ?0406  ?NA 138 140  ?K 3.7 4.2  ?CL 106 111  ?CO2 24 22  ?GLUCOSE 93 116*  ?BUN 10 20  ?CREATININE 0.75 0.85  ?CALCIUM 9.1 9.1  ?ALKPHOS 53  --   ?AST 15  --   ?ALT 10  --   ?ALBUMIN 3.7  --   ? ?ESR 4 ?CRP 1.0 ? ?Results/Tests Pending at Time of Discharge: None ? ?Discharge Medications:  ?Allergies as of 12/15/2021   ? ?   Reactions  ? Penicillins Other (See Comments)  ? Mother endorsed reaction as a child.  ? ?  ? ?  ?Medication List  ?  ? ?TAKE these medications   ? ?acetaminophen 500 MG tablet ?Commonly known as: TYLENOL ?Take 1,000 mg by mouth every 6 (six) hours as needed for moderate pain. ?  ?cephALEXin 500 MG capsule ?Commonly known as: KEFLEX ?Take 1 capsule (500 mg total) by mouth 3 (three) times daily for 5 days. ?  ?ibuprofen 200 MG tablet ?Commonly known as: ADVIL ?Take 3 tablets (600 mg total) by mouth every 6 (six) hours as needed. ?  ? ?  ? ? ?Discharge Instructions: Please refer to Patient Instructions section of EMR for full details.  Patient was counseled important signs and symptoms that should prompt return to medical care, changes in medications, dietary instructions, activity restrictions, and follow up appointments.  ? ?Follow-Up Appointments: ? Follow-up Information   ? ? Lorenda Peck, MD. Call.   ?Specialty:  Podiatry ?Why: Please call for an appointment early next week, Monday would be best ?Contact information: ?[address] ?[address] ?[place] [address] ?[phone] ? ? ?  ?  ? ?  ?  ? ?  ? ? ?Eppie Gibson, MD ?12/15/2021, 2:35 PM ?PGY-1, [facility] ? ?

## 2021-12-15 NOTE — Discharge Instructions (Addendum)
Dear [name], ? ?Thank you for letting us participate in your care. You were hospitalized for foot pain and diagnosed with bursitis and a soft-tissue infection. You were treated with antibiotics and nonsteroidal pain medications.  ? ?POST-HOSPITAL & CARE INSTRUCTIONS ?We are sending you home with an antibiotic called Keflex that you should take 3 times daily for the next 5 days ?The best pain options for your bursitis are going to be NSAIDs, ibuprofen 600 mg every 6 hours as a good option.  You can also keep taking your chronic opioids and your Tylenol ?Go to your follow up appointments (listed below) ? ? ?DOCTOR'S APPOINTMENT   ?No future appointments. ? Follow-up Information   ? ? Louann Sjogren, MD. Call.   ?Specialty: Podiatry ?Why: Please call for an appointment early next week, Monday would be best ?Contact information: ?[address] ?[address] ?[place] [address] ?[phone] ? ? ?  ?  ? ?  ?  ? ?  ? ? ?Take care and be well! ? ?Family Medicine Teaching Service Inpatient Team ?[facility]  ?[facility]  ?[address] [place], [place] [address] ?[phone] ? ? ? ?

## 2021-12-15 NOTE — Progress Notes (Signed)
Family Medicine Teaching Service ?Daily Progress Note ?Intern Pager: [phone] ? ?Patient name: [name] Medical record number: [id number] ?Date of birth: 1986/08/20 Age: 36 y.o. Gender: female ? ?Primary Care Provider: Osborn Coho, MD ?Consultants: Podiatry ?Code Status: Full ? ?Pt Overview and Major Events to Date:  ?4/16- admitted ? ?Assessment and Plan: ?[name] is a 36yo female p/w a R foot soft tissue infection, currently receiving antibiotics and awaiting possible surgical intervention. PMH significant for HSV-1, obesity, chronic headaches.  ? ?Possible R foot soft tissue infection vs adventitial bursitis ?Pt remains afebrile and HDS. MRI yesterday without abscess formation, did show lateral forefoot soft tissue swelling as observed on XR. No abscess, though there is a 9 x 5 x 4 mm cystic lesion at the lateral plantar aspect of the fourth metatarsal head without surrounding inflammatory change, likely an adventitial bursa or small ganglion cyst. Given these imaging findings taken together with no elevation of WBC or inflammatory markers, consider that adventitial bursitis is the most likely cause of her symptoms. Will defer to surgery on need for surgical intervention but seems unlikely. ?- Can transition pain control to NSAIDs for treatment of bursitis ?- D/c antibiotics ?- Continue Tylenol q6h ?- Possibly home today if cleared by surgery ? ?Elevated BP ?BP remains elevated ON to 140s-150s/80s-90s. Likely in the setting of acute pain, will need outpt follow-up. ?- Continue to monitor ? ?Chronic headaches  Chronic Back Pain ?- Pain control as above ? ?Panic disorder ?No panic symptoms at this time. ?- Outpt f/u ? ?Tobacco Use Disorder ?- Nicotine patch ? ?FEN/GI: NPO ?PPx: Padua risk score 1, ppx not indicated ?Dispo:Home today vs tomorrow. Barriers include pain control and possible surgery.  ? ?Subjective:  ?Ms. Frasier reports that her pain is much improved today compared to yesterday. She is  still having pain that is insufficiently treated with oxycodone 5mg  daily.   ? ?Objective: ?Temp:  [98.2 ?F (36.8 ?C)] 98.2 ?F (36.8 ?C) (04/17 0409) ?Pulse Rate:  [65-76] 68 (04/17 0835) ?Resp:  [17-18] 18 (04/17 0835) ?BP: (140-155)/(75-104) 140/75 (04/17 0835) ?SpO2:  [96 %-100 %] 100 % (04/17 0835) ?Physical Exam: ?General: Sleeping on arrival, awakens easily to voice ?Cardiovascular: RRR, no murmur, rub, gallop ?Respiratory: Normal WOB on RA, lungs clear throughout ?Abdomen: Soft and non-tender, non-distended ?Extremities: R foot with what appears to be a plantar wart, no surrounding erythema though the surrounding area is quite tender to palpation. No streaking or warmth appreciated ? ?Laboratory: ?Recent Labs  ?Lab 12/14/21 ?0930 12/15/21 ?0406  ?WBC 10.0 9.2  ?HGB 10.5* 10.4*  ?HCT 35.8* 36.2  ?PLT 373 335  ? ?Recent Labs  ?Lab 12/14/21 ?0930 12/15/21 ?0406  ?NA 138 140  ?K 3.7 4.2  ?CL 106 111  ?CO2 24 22  ?BUN 10 20  ?CREATININE 0.75 0.85  ?CALCIUM 9.1 9.1  ?PROT 6.7  --   ?BILITOT 0.4  --   ?ALKPHOS 53  --   ?ALT 10  --   ?AST 15  --   ?GLUCOSE 93 116*  ? ? ?Imaging/Diagnostic Tests: ?MR FOOT RIGHT W WO CONTRAST ?CLINICAL DATA:  Right foot pain, swelling, and redness. ? ?EXAM: ?MRI OF THE RIGHT FOREFOOT WITHOUT AND WITH CONTRAST ? ?TECHNIQUE: ?Multiplanar, multisequence MR imaging of the right forefoot was ?performed before and after the administration of intravenous ?contrast. ? ?CONTRAST:  62mL GADAVIST GADOBUTROL 1 MMOL/ML IV SOLN ? ?COMPARISON:  Right foot x-rays from same day. ? ?FINDINGS: ?Bones/Joint/Cartilage ? ?No marrow signal abnormality.  No fracture or dislocation. Joint ?spaces are preserved. No joint effusion. ? ?Ligaments ? ?Collateral ligaments are intact.  Lisfranc ligament is intact. ? ?Muscles and Tendons ?Flexor and extensor tendons are intact. No muscle edema or atrophy. ? ?Soft tissue ?Mild lateral forefoot soft tissue swelling adjacent to the fifth ?metatarsal, without enhancement.  No fluid collection. ? ?9 x 5 x 4 mm cystic lesion without enhancement at the lateral ?plantar aspect of the fourth metatarsal head without surrounding ?inflammatory change. ? ?IMPRESSION: ?1. No abscess. ?2. 9 x 5 x 4 mm cystic lesion at the lateral plantar aspect of the ?fourth metatarsal head without surrounding inflammatory change, ?likely an adventitial bursa or small ganglion cyst. ? ?Electronically Signed ?  By: Obie Dredge M.D. ?  On: 12/14/2021 19:00 ?DG Foot Complete Right ?CLINICAL DATA:  swollen, painful to touch ? ?EXAM: ?RIGHT FOOT COMPLETE - 3+ VIEW ? ?COMPARISON:  None. ? ?FINDINGS: ?No cortical erosion or destruction. There is no evidence of fracture ?or dislocation. Posterior calcaneal spur. There is no evidence of ?arthropathy or other focal bone abnormality. Subcutaneus soft tissue ?edema of the lateral forefoot. No retained radiopaque foreign body. ? ?IMPRESSION: ?1. Subcutaneus soft tissue edema of the lateral forefoot. No ?retained radiopaque foreign body. ?2. No radiographic findings suggest osteomyelitis. ?3.  No acute displaced fracture or dislocation. ? ?Electronically Signed ?  By: Tish Frederickson M.D. ?  On: 12/14/2021 02:02 ? ? ? ?Alicia Amel, MD ?12/15/2021, 9:30 AM ?PGY-1, [facility] ?FPTS Intern pager: [phone], text pages welcome ? ?

## 2023-02-16 LAB — OB RESULTS CONSOLE HIV ANTIBODY (ROUTINE TESTING): HIV: NONREACTIVE

## 2023-02-16 LAB — OB RESULTS CONSOLE RUBELLA ANTIBODY, IGM: Rubella: IMMUNE

## 2023-02-16 LAB — OB RESULTS CONSOLE RPR: RPR: NONREACTIVE

## 2023-02-16 LAB — OB RESULTS CONSOLE HEPATITIS B SURFACE ANTIGEN: Hepatitis B Surface Ag: NEGATIVE

## 2023-02-17 ENCOUNTER — Other Ambulatory Visit: Payer: Self-pay | Admitting: Obstetrics and Gynecology

## 2023-02-17 DIAGNOSIS — Z363 Encounter for antenatal screening for malformations: Secondary | ICD-10-CM

## 2023-03-12 ENCOUNTER — Encounter (HOSPITAL_COMMUNITY): Payer: Self-pay | Admitting: *Deleted

## 2023-03-12 ENCOUNTER — Inpatient Hospital Stay (HOSPITAL_COMMUNITY)
Admission: AD | Admit: 2023-03-12 | Discharge: 2023-03-12 | Disposition: A | Discharge status: Home / Self Care | Payer: Medicaid Other | Attending: Obstetrics and Gynecology | Admitting: Obstetrics and Gynecology

## 2023-03-12 DIAGNOSIS — Z3A22 22 weeks gestation of pregnancy: Secondary | ICD-10-CM | POA: Insufficient documentation

## 2023-03-12 DIAGNOSIS — O10912 Unspecified pre-existing hypertension complicating pregnancy, second trimester: Secondary | ICD-10-CM | POA: Insufficient documentation

## 2023-03-12 DIAGNOSIS — Z79899 Other long term (current) drug therapy: Secondary | ICD-10-CM | POA: Insufficient documentation

## 2023-03-12 DIAGNOSIS — Z880 Allergy status to penicillin: Secondary | ICD-10-CM | POA: Insufficient documentation

## 2023-03-12 DIAGNOSIS — R519 Headache, unspecified: Secondary | ICD-10-CM | POA: Insufficient documentation

## 2023-03-12 DIAGNOSIS — O0942 Supervision of pregnancy with grand multiparity, second trimester: Secondary | ICD-10-CM | POA: Diagnosis not present

## 2023-03-12 LAB — COMPREHENSIVE METABOLIC PANEL
ALT: 11 U/L (ref 0–44)
AST: 14 U/L — ABNORMAL LOW (ref 15–41)
Albumin: 2.7 g/dL — ABNORMAL LOW (ref 3.5–5.0)
Alkaline Phosphatase: 39 U/L (ref 38–126)
Anion gap: 8 (ref 5–15)
BUN: 6 mg/dL (ref 6–20)
CO2: 18 mmol/L — ABNORMAL LOW (ref 22–32)
Calcium: 8.8 mg/dL — ABNORMAL LOW (ref 8.9–10.3)
Chloride: 109 mmol/L (ref 98–111)
Creatinine, Ser: 0.44 mg/dL (ref 0.44–1.00)
GFR, Estimated: >60 mL/min (ref >60)
Glucose, Bld: 86 mg/dL (ref 70–99)
Potassium: 3.4 mmol/L — ABNORMAL LOW (ref 3.5–5.1)
Sodium: 135 mmol/L (ref 135–145)
Total Bilirubin: 0.3 mg/dL (ref 0.3–1.2)
Total Protein: 6.1 g/dL — ABNORMAL LOW (ref 6.5–8.1)

## 2023-03-12 LAB — PROTEIN / CREATININE RATIO, URINE
Creatinine, Urine: 14 mg/dL
Total Protein, Urine: <6 mg/dL

## 2023-03-12 LAB — URINALYSIS, ROUTINE W REFLEX MICROSCOPIC
Bilirubin Urine: NEGATIVE
Glucose, UA: NEGATIVE mg/dL
Hgb urine dipstick: NEGATIVE
Ketones, ur: NEGATIVE mg/dL
Nitrite: NEGATIVE
Protein, ur: NEGATIVE mg/dL
Specific Gravity, Urine: 1.003 — ABNORMAL LOW (ref 1.005–1.030)
pH: 6 (ref 5.0–8.0)

## 2023-03-12 LAB — CBC
HCT: 34 % — ABNORMAL LOW (ref 36.0–46.0)
Hemoglobin: 10.9 g/dL — ABNORMAL LOW (ref 12.0–15.0)
MCH: 24.7 pg — ABNORMAL LOW (ref 26.0–34.0)
MCHC: 32.1 g/dL (ref 30.0–36.0)
MCV: 76.9 fL — ABNORMAL LOW (ref 80.0–100.0)
Platelets: 183 10*3/uL (ref 150–400)
RBC: 4.42 MIL/uL (ref 3.87–5.11)
RDW: 22.1 % — ABNORMAL HIGH (ref 11.5–15.5)
WBC: 9.3 10*3/uL (ref 4.0–10.5)
nRBC: 0 % (ref 0.0–0.2)

## 2023-03-12 MED ORDER — NIFEDIPINE ER OSMOTIC RELEASE 30 MG PO TB24
30.0000 mg | ORAL_TABLET | Freq: Once | ORAL | Status: AC
  Administered 2023-03-12: 30 mg via ORAL
  Filled 2023-03-12: qty 1

## 2023-03-12 MED ORDER — LABETALOL HCL 5 MG/ML IV SOLN
80.0000 mg | INTRAVENOUS | Status: DC | PRN
Start: 2023-03-12 — End: 2023-03-12

## 2023-03-12 MED ORDER — LABETALOL HCL 5 MG/ML IV SOLN
20.0000 mg | INTRAVENOUS | Status: DC | PRN
  Administered 2023-03-12: 20 mg via INTRAVENOUS
  Filled 2023-03-12: qty 4

## 2023-03-12 MED ORDER — NIFEDIPINE ER OSMOTIC RELEASE 60 MG PO TB24: 60.0000 mg | ORAL_TABLET | Freq: Every day | ORAL | 2 refills | Status: DC

## 2023-03-12 MED ORDER — HYDRALAZINE HCL 20 MG/ML IJ SOLN: 10.0000 mg | INTRAMUSCULAR | Status: DC | PRN

## 2023-03-12 MED ORDER — LABETALOL HCL 5 MG/ML IV SOLN: 40.0000 mg | INTRAVENOUS | Status: DC | PRN

## 2023-03-12 NOTE — MAU Note (Signed)
[name] is a 37 y.o. at [redacted]w[redacted]d, here in MAU reporting: sent from office with elevated BP.  Hx of CHTN, was started on Procardia a couple wks ago. +HA- has not taken anything, seeing little black spots, denies epigastric pain, reports increase in swelling.  Low back pain, constant ache- her normal.  Increase in wrist pain, increasing carpal tunnel(bil)- started about a wk ago. . Denies bleeding or leaking.  Reports +FM  Onset of complaint: gradual increase Pain score: 5 Vitals:   03/12/23 1031  BP: (!) 162/86  Pulse: 66  Resp: 18  Temp: 97.9 F (36.6 C)  SpO2: 99%     FHT:136 Lab orders placed from triage:  urine obtained
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
## 2023-03-15 DIAGNOSIS — O10919 Unspecified pre-existing hypertension complicating pregnancy, unspecified trimester: Secondary | ICD-10-CM | POA: Insufficient documentation

## 2023-03-16 ENCOUNTER — Other Ambulatory Visit: Payer: Self-pay

## 2023-03-22 ENCOUNTER — Encounter: Payer: Self-pay | Admitting: *Deleted

## 2023-03-22 ENCOUNTER — Other Ambulatory Visit: Payer: Self-pay | Admitting: *Deleted

## 2023-03-22 ENCOUNTER — Ambulatory Visit: Payer: Medicaid Other | Attending: Obstetrics and Gynecology

## 2023-03-22 ENCOUNTER — Ambulatory Visit: Payer: Medicaid Other | Admitting: *Deleted

## 2023-03-22 VITALS — BP 149/75 | HR 74

## 2023-03-22 DIAGNOSIS — Z363 Encounter for antenatal screening for malformations: Secondary | ICD-10-CM | POA: Diagnosis present

## 2023-03-22 DIAGNOSIS — O10912 Unspecified pre-existing hypertension complicating pregnancy, second trimester: Secondary | ICD-10-CM

## 2023-03-22 DIAGNOSIS — O10919 Unspecified pre-existing hypertension complicating pregnancy, unspecified trimester: Secondary | ICD-10-CM | POA: Diagnosis present

## 2023-03-22 DIAGNOSIS — Z362 Encounter for other antenatal screening follow-up: Secondary | ICD-10-CM

## 2023-03-24 ENCOUNTER — Encounter: Payer: Self-pay | Admitting: *Deleted

## 2023-04-19 ENCOUNTER — Ambulatory Visit: Payer: Medicaid Other | Admitting: *Deleted

## 2023-04-19 ENCOUNTER — Other Ambulatory Visit: Payer: Self-pay | Admitting: *Deleted

## 2023-04-19 ENCOUNTER — Encounter: Payer: Self-pay | Admitting: *Deleted

## 2023-04-19 ENCOUNTER — Ambulatory Visit: Payer: Medicaid Other | Attending: Obstetrics and Gynecology

## 2023-04-19 DIAGNOSIS — O10912 Unspecified pre-existing hypertension complicating pregnancy, second trimester: Secondary | ICD-10-CM | POA: Diagnosis present

## 2023-04-19 DIAGNOSIS — O09293 Supervision of pregnancy with other poor reproductive or obstetric history, third trimester: Secondary | ICD-10-CM | POA: Diagnosis not present

## 2023-04-19 DIAGNOSIS — O10013 Pre-existing essential hypertension complicating pregnancy, third trimester: Secondary | ICD-10-CM | POA: Diagnosis not present

## 2023-04-19 DIAGNOSIS — Z362 Encounter for other antenatal screening follow-up: Secondary | ICD-10-CM | POA: Diagnosis present

## 2023-04-19 DIAGNOSIS — O0933 Supervision of pregnancy with insufficient antenatal care, third trimester: Secondary | ICD-10-CM

## 2023-04-19 DIAGNOSIS — O10919 Unspecified pre-existing hypertension complicating pregnancy, unspecified trimester: Secondary | ICD-10-CM

## 2023-04-19 DIAGNOSIS — E669 Obesity, unspecified: Secondary | ICD-10-CM

## 2023-04-19 DIAGNOSIS — Z3A28 28 weeks gestation of pregnancy: Secondary | ICD-10-CM

## 2023-04-19 DIAGNOSIS — O34219 Maternal care for unspecified type scar from previous cesarean delivery: Secondary | ICD-10-CM | POA: Diagnosis not present

## 2023-04-19 DIAGNOSIS — O09213 Supervision of pregnancy with history of pre-term labor, third trimester: Secondary | ICD-10-CM

## 2023-04-19 DIAGNOSIS — O99213 Obesity complicating pregnancy, third trimester: Secondary | ICD-10-CM

## 2023-05-13 ENCOUNTER — Other Ambulatory Visit: Payer: Self-pay

## 2023-05-13 ENCOUNTER — Encounter: Payer: Self-pay | Admitting: *Deleted

## 2023-05-13 DIAGNOSIS — O24419 Gestational diabetes mellitus in pregnancy, unspecified control: Secondary | ICD-10-CM | POA: Insufficient documentation

## 2023-05-17 ENCOUNTER — Ambulatory Visit: Payer: Medicaid Other

## 2023-05-17 ENCOUNTER — Ambulatory Visit: Payer: Medicaid Other | Attending: Obstetrics and Gynecology

## 2023-05-24 ENCOUNTER — Ambulatory Visit: Payer: Medicaid Other

## 2023-05-24 ENCOUNTER — Ambulatory Visit: Payer: Medicaid Other | Attending: Obstetrics and Gynecology

## 2023-06-08 NOTE — Patient Instructions (Addendum)
[name]  06/08/2023   Your procedure is scheduled on:  06/22/2023  Arrive at 0800 at Entrance C on [address] at [facility]  and [employer]. You are invited to use the FREE valet parking or use the Visitor's parking deck.  Pick up the phone at the desk and dial [phone].  Call this number if you have problems the morning of surgery: [phone]  Remember:   Do not eat food:(After Midnight) Desps de medianoche.  Do not drink clear liquids: (After Midnight) Desps de medianoche.  Take these medicines the morning of surgery with A SIP OF WATER:  Nifedipine, labetalol and hydrochlorothiazide as prescribed     Do not wear jewelry, make-up or nail polish.  Do not wear lotions, powders, or perfumes. Do not wear deodorant.  Do not shave 48 hours prior to surgery.  Do not bring valuables to the hospital.  [facility] is not   responsible for any belongings or valuables brought to the hospital.  Contacts, dentures or bridgework may not be worn into surgery.  Leave suitcase in the car. After surgery it may be brought to your room.  For patients admitted to the hospital, checkout time is 11:00 AM the day of              discharge.      Please read over the following fact sheets that you were given:     Preparing for Surgery

## 2023-06-10 ENCOUNTER — Telehealth (HOSPITAL_COMMUNITY): Payer: Self-pay | Admitting: *Deleted

## 2023-06-10 NOTE — Telephone Encounter (Signed)
Preadmission screen  

## 2023-06-11 ENCOUNTER — Telehealth (HOSPITAL_COMMUNITY): Payer: Self-pay | Admitting: *Deleted

## 2023-06-11 NOTE — Telephone Encounter (Signed)
Preadmission screen  

## 2023-06-14 ENCOUNTER — Encounter (HOSPITAL_COMMUNITY): Payer: Self-pay
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
## 2023-06-17 ENCOUNTER — Encounter (HOSPITAL_COMMUNITY): Payer: Self-pay | Admitting: Obstetrics and Gynecology

## 2023-06-17 ENCOUNTER — Inpatient Hospital Stay (HOSPITAL_COMMUNITY)
Admission: AD | Admit: 2023-06-17 | Discharge: 2023-06-22 | DRG: 788 | Disposition: A | Discharge status: Home / Self Care | Payer: Medicaid Other | Attending: Obstetrics and Gynecology | Admitting: Obstetrics and Gynecology

## 2023-06-17 DIAGNOSIS — Z98891 History of uterine scar from previous surgery: Secondary | ICD-10-CM

## 2023-06-17 DIAGNOSIS — O1493 Unspecified pre-eclampsia, third trimester: Principal | ICD-10-CM

## 2023-06-17 DIAGNOSIS — O114 Pre-existing hypertension with pre-eclampsia, complicating childbirth: Principal | ICD-10-CM | POA: Diagnosis present

## 2023-06-17 DIAGNOSIS — Z8249 Family history of ischemic heart disease and other diseases of the circulatory system: Secondary | ICD-10-CM

## 2023-06-17 DIAGNOSIS — Z79899 Other long term (current) drug therapy: Secondary | ICD-10-CM

## 2023-06-17 DIAGNOSIS — Z825 Family history of asthma and other chronic lower respiratory diseases: Secondary | ICD-10-CM

## 2023-06-17 DIAGNOSIS — Z3A36 36 weeks gestation of pregnancy: Secondary | ICD-10-CM

## 2023-06-17 DIAGNOSIS — Z87891 Personal history of nicotine dependence: Secondary | ICD-10-CM

## 2023-06-17 DIAGNOSIS — Z880 Allergy status to penicillin: Secondary | ICD-10-CM

## 2023-06-17 DIAGNOSIS — O1092 Unspecified pre-existing hypertension complicating childbirth: Secondary | ICD-10-CM | POA: Diagnosis present

## 2023-06-17 DIAGNOSIS — O119 Pre-existing hypertension with pre-eclampsia, unspecified trimester: Secondary | ICD-10-CM | POA: Diagnosis present

## 2023-06-17 DIAGNOSIS — O9902 Anemia complicating childbirth: Secondary | ICD-10-CM | POA: Diagnosis present

## 2023-06-17 DIAGNOSIS — O99214 Obesity complicating childbirth: Secondary | ICD-10-CM | POA: Diagnosis present

## 2023-06-17 DIAGNOSIS — O34211 Maternal care for low transverse scar from previous cesarean delivery: Principal | ICD-10-CM | POA: Diagnosis present

## 2023-06-17 DIAGNOSIS — O99210 Obesity complicating pregnancy, unspecified trimester: Secondary | ICD-10-CM | POA: Diagnosis present

## 2023-06-17 DIAGNOSIS — Z818 Family history of other mental and behavioral disorders: Secondary | ICD-10-CM

## 2023-06-17 DIAGNOSIS — O1410 Severe pre-eclampsia, unspecified trimester: Secondary | ICD-10-CM | POA: Diagnosis present

## 2023-06-17 DIAGNOSIS — O1210 Gestational proteinuria, unspecified trimester: Secondary | ICD-10-CM | POA: Diagnosis present

## 2023-06-17 DIAGNOSIS — Z7982 Long term (current) use of aspirin: Secondary | ICD-10-CM

## 2023-06-17 DIAGNOSIS — Z5986 Financial insecurity: Secondary | ICD-10-CM

## 2023-06-17 LAB — URINALYSIS, ROUTINE W REFLEX MICROSCOPIC
Bilirubin Urine: NEGATIVE
Glucose, UA: NEGATIVE mg/dL
Hgb urine dipstick: NEGATIVE
Ketones, ur: NEGATIVE mg/dL
Nitrite: NEGATIVE
Protein, ur: 100 mg/dL — AB
Specific Gravity, Urine: 1.011 (ref 1.005–1.030)
pH: 7 (ref 5.0–8.0)

## 2023-06-17 MED ORDER — LABETALOL HCL 5 MG/ML IV SOLN
INTRAVENOUS | Status: AC
  Administered 2023-06-17: 20 mg via INTRAVENOUS
  Filled 2023-06-17: qty 4

## 2023-06-17 MED ORDER — HYDRALAZINE HCL 20 MG/ML IJ SOLN: 10.0000 mg | INTRAMUSCULAR | Status: DC | PRN

## 2023-06-17 MED ORDER — LABETALOL HCL 5 MG/ML IV SOLN: 80.0000 mg | INTRAVENOUS | Status: DC | PRN

## 2023-06-17 MED ORDER — LABETALOL HCL 5 MG/ML IV SOLN: 40.0000 mg | INTRAVENOUS | Status: DC | PRN

## 2023-06-17 MED ORDER — LABETALOL HCL 5 MG/ML IV SOLN: 20.0000 mg | INTRAVENOUS | Status: DC | PRN
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
## 2023-06-17 NOTE — MAU Note (Addendum)
..  [name] is a 37 y.o. at [redacted]w[redacted]d here in MAU reporting: elevated blood pressure at home of Reports 7/10 headache all day Visual changes: blurry vision all day Sharp epigastric pain that began a few days ago.  Swelling in her hands and feet.   Irregular contractions all day. +FM. Denies leaking of fluid or vaginal bleeding.   Takes Nifedipine, labetalol, and hydrochlorothiazide  at home Took (325mg  x3) tylenol around 1700, did not help.     Vitals:   06/17/23 2302 06/17/23 2306  BP: (!) 167/89   Pulse: 77   Resp: 19   Temp:  97.7 F (36.5 C)  SpO2: 100%      ZOX:[id number] in room 130's  Lab orders placed from triage: UA  Has scheduled c-section on Tuesday  1500 had sandwich and lo Mein 2230 water

## 2023-06-18 ENCOUNTER — Other Ambulatory Visit: Payer: Self-pay

## 2023-06-18 ENCOUNTER — Encounter (HOSPITAL_COMMUNITY)
Admission: AD | Disposition: A | Discharge status: Home / Self Care | Payer: Self-pay | Source: Home / Self Care | Attending: Obstetrics and Gynecology

## 2023-06-18 ENCOUNTER — Encounter (HOSPITAL_COMMUNITY): Payer: Self-pay | Admitting: Obstetrics and Gynecology

## 2023-06-18 ENCOUNTER — Observation Stay (HOSPITAL_COMMUNITY): Payer: Medicaid Other | Admitting: Certified Registered Nurse Anesthetist

## 2023-06-18 DIAGNOSIS — Z87891 Personal history of nicotine dependence: Secondary | ICD-10-CM | POA: Diagnosis not present

## 2023-06-18 DIAGNOSIS — Z3A36 36 weeks gestation of pregnancy: Secondary | ICD-10-CM

## 2023-06-18 DIAGNOSIS — O34211 Maternal care for low transverse scar from previous cesarean delivery: Secondary | ICD-10-CM | POA: Diagnosis present

## 2023-06-18 DIAGNOSIS — O114 Pre-existing hypertension with pre-eclampsia, complicating childbirth: Secondary | ICD-10-CM | POA: Diagnosis present

## 2023-06-18 DIAGNOSIS — Z7982 Long term (current) use of aspirin: Secondary | ICD-10-CM | POA: Diagnosis not present

## 2023-06-18 DIAGNOSIS — O1413 Severe pre-eclampsia, third trimester: Secondary | ICD-10-CM

## 2023-06-18 DIAGNOSIS — Z98891 History of uterine scar from previous surgery: Secondary | ICD-10-CM

## 2023-06-18 DIAGNOSIS — O1493 Unspecified pre-eclampsia, third trimester: Principal | ICD-10-CM | POA: Diagnosis present

## 2023-06-18 DIAGNOSIS — O119 Pre-existing hypertension with pre-eclampsia, unspecified trimester: Secondary | ICD-10-CM | POA: Diagnosis present

## 2023-06-18 DIAGNOSIS — Z8249 Family history of ischemic heart disease and other diseases of the circulatory system: Secondary | ICD-10-CM | POA: Diagnosis not present

## 2023-06-18 DIAGNOSIS — O1410 Severe pre-eclampsia, unspecified trimester: Secondary | ICD-10-CM | POA: Diagnosis present

## 2023-06-18 DIAGNOSIS — O9902 Anemia complicating childbirth: Secondary | ICD-10-CM | POA: Diagnosis present

## 2023-06-18 DIAGNOSIS — O99214 Obesity complicating childbirth: Secondary | ICD-10-CM | POA: Diagnosis present

## 2023-06-18 DIAGNOSIS — Z79899 Other long term (current) drug therapy: Secondary | ICD-10-CM | POA: Diagnosis not present

## 2023-06-18 DIAGNOSIS — Z818 Family history of other mental and behavioral disorders: Secondary | ICD-10-CM | POA: Diagnosis not present

## 2023-06-18 DIAGNOSIS — Z880 Allergy status to penicillin: Secondary | ICD-10-CM | POA: Diagnosis not present

## 2023-06-18 DIAGNOSIS — Z5986 Financial insecurity: Secondary | ICD-10-CM | POA: Diagnosis not present

## 2023-06-18 DIAGNOSIS — Z825 Family history of asthma and other chronic lower respiratory diseases: Secondary | ICD-10-CM | POA: Diagnosis not present

## 2023-06-18 LAB — CBC
HCT: 41.4 % (ref 36.0–46.0)
HCT: 38.2 % (ref 36.0–46.0)
Hemoglobin: 14.1 g/dL (ref 12.0–15.0)
Hemoglobin: 13.2 g/dL (ref 12.0–15.0)
MCH: 28.3 pg (ref 26.0–34.0)
MCH: 29 pg (ref 26.0–34.0)
MCHC: 34.1 g/dL (ref 30.0–36.0)
MCHC: 34.6 g/dL (ref 30.0–36.0)
MCV: 83.1 fL (ref 80.0–100.0)
MCV: 84 fL (ref 80.0–100.0)
Platelets: 194 10*3/uL (ref 150–400)
Platelets: 194 10*3/uL (ref 150–400)
RBC: 4.98 MIL/uL (ref 3.87–5.11)
RBC: 4.55 MIL/uL (ref 3.87–5.11)
RDW: 16.1 % — ABNORMAL HIGH (ref 11.5–15.5)
RDW: 16.2 % — ABNORMAL HIGH (ref 11.5–15.5)
WBC: 11.6 10*3/uL — ABNORMAL HIGH (ref 4.0–10.5)
WBC: 9.2 10*3/uL (ref 4.0–10.5)
nRBC: 0 % (ref 0.0–0.2)
nRBC: 0 % (ref 0.0–0.2)

## 2023-06-18 LAB — TYPE AND SCREEN
ABO/RH(D): A POS
Antibody Screen: NEGATIVE

## 2023-06-18 LAB — COMPREHENSIVE METABOLIC PANEL
ALT: 11 U/L (ref 0–44)
ALT: 11 U/L (ref 0–44)
AST: 13 U/L — ABNORMAL LOW (ref 15–41)
AST: 13 U/L — ABNORMAL LOW (ref 15–41)
Albumin: 3 g/dL — ABNORMAL LOW (ref 3.5–5.0)
Albumin: 2.8 g/dL — ABNORMAL LOW (ref 3.5–5.0)
Alkaline Phosphatase: 88 U/L (ref 38–126)
Alkaline Phosphatase: 91 U/L (ref 38–126)
Anion gap: 11 (ref 5–15)
Anion gap: 11 (ref 5–15)
BUN: 9 mg/dL (ref 6–20)
BUN: 7 mg/dL (ref 6–20)
CO2: 17 mmol/L — ABNORMAL LOW (ref 22–32)
CO2: 21 mmol/L — ABNORMAL LOW (ref 22–32)
Calcium: 9.5 mg/dL (ref 8.9–10.3)
Calcium: 7.8 mg/dL — ABNORMAL LOW (ref 8.9–10.3)
Chloride: 104 mmol/L (ref 98–111)
Chloride: 101 mmol/L (ref 98–111)
Creatinine, Ser: 0.58 mg/dL (ref 0.44–1.00)
Creatinine, Ser: 0.68 mg/dL (ref 0.44–1.00)
GFR, Estimated: >60 mL/min (ref >60)
GFR, Estimated: >60 mL/min (ref >60)
Glucose, Bld: 77 mg/dL (ref 70–99)
Glucose, Bld: 90 mg/dL (ref 70–99)
Potassium: 3.6 mmol/L (ref 3.5–5.1)
Potassium: 4 mmol/L (ref 3.5–5.1)
Sodium: 132 mmol/L — ABNORMAL LOW (ref 135–145)
Sodium: 133 mmol/L — ABNORMAL LOW (ref 135–145)
Total Bilirubin: 0.4 mg/dL (ref 0.3–1.2)
Total Bilirubin: 0.6 mg/dL (ref 0.3–1.2)
Total Protein: 6.7 g/dL (ref 6.5–8.1)
Total Protein: 6.3 g/dL — ABNORMAL LOW (ref 6.5–8.1)

## 2023-06-18 LAB — WET PREP, GENITAL
Clue Cells Wet Prep HPF POC: NONE SEEN
Sperm: NONE SEEN
Trich, Wet Prep: NONE SEEN
WBC, Wet Prep HPF POC: <10 (ref <10)
Yeast Wet Prep HPF POC: NONE SEEN

## 2023-06-18 LAB — PROTEIN / CREATININE RATIO, URINE
Creatinine, Urine: 58 mg/dL
Protein Creatinine Ratio: 1.38 mg/mg{creat} — ABNORMAL HIGH (ref 0.00–0.15)
Total Protein, Urine: 80 mg/dL

## 2023-06-18 LAB — MAGNESIUM: Magnesium: 5 mg/dL — ABNORMAL HIGH (ref 1.7–2.4)

## 2023-06-18 SURGERY — Surgical Case
Anesthesia: Spinal

## 2023-06-18 MED ORDER — MISOPROSTOL 200 MCG PO TABS
800.0000 ug | ORAL_TABLET | Freq: Once | ORAL | Status: AC
  Administered 2023-06-18: 800 ug via RECTAL
  Filled 2023-06-18: qty 4

## 2023-06-18 MED ORDER — ONDANSETRON HCL 4 MG/2ML IJ SOLN
4.0000 mg | Freq: Three times a day (TID) | INTRAMUSCULAR | Status: DC | PRN
  Administered 2023-06-19: 4 mg via INTRAVENOUS
  Filled 2023-06-18: qty 2

## 2023-06-18 MED ORDER — DEXAMETHASONE SODIUM PHOSPHATE 10 MG/ML IJ SOLN
INTRAMUSCULAR | Status: DC | PRN
  Administered 2023-06-18: 10 mg via INTRAVENOUS

## 2023-06-18 MED ORDER — MORPHINE SULFATE (PF) 0.5 MG/ML IJ SOLN
INTRAMUSCULAR | Status: DC | PRN
  Administered 2023-06-18: 150 ug via INTRATHECAL

## 2023-06-18 MED ORDER — PRENATAL MULTIVITAMIN CH
1.0000 | ORAL_TABLET | Freq: Every day | ORAL | Status: DC
  Administered 2023-06-19 – 2023-06-21 (×3): 1 via ORAL
  Filled 2023-06-18 (×3): qty 1

## 2023-06-18 MED ORDER — DIPHENHYDRAMINE HCL 25 MG PO CAPS: 25.0000 mg | ORAL_CAPSULE | ORAL | Status: DC | PRN

## 2023-06-18 MED ORDER — NIFEDIPINE ER OSMOTIC RELEASE 60 MG PO TB24
60.0000 mg | ORAL_TABLET | Freq: Every day | ORAL | Status: DC
  Administered 2023-06-18: 60 mg via ORAL
  Filled 2023-06-18: qty 1

## 2023-06-18 MED ORDER — ONDANSETRON HCL 4 MG/2ML IJ SOLN
INTRAMUSCULAR | Status: DC | PRN
  Administered 2023-06-18: 4 mg via INTRAVENOUS

## 2023-06-18 MED ORDER — STERILE WATER FOR IRRIGATION IR SOLN
Status: DC | PRN
Start: 2023-06-18 — End: 2023-06-18
  Administered 2023-06-18: 1000 mL

## 2023-06-18 MED ORDER — HYDROMORPHONE HCL 1 MG/ML IJ SOLN
0.2000 mg | INTRAMUSCULAR | Status: DC | PRN
  Administered 2023-06-19 – 2023-06-20 (×2): 0.6 mg via INTRAVENOUS
  Filled 2023-06-18 (×2): qty 1

## 2023-06-18 MED ORDER — LABETALOL HCL 5 MG/ML IV SOLN: 40.0000 mg | INTRAVENOUS | Status: DC | PRN

## 2023-06-18 MED ORDER — OXYTOCIN-SODIUM CHLORIDE 30-0.9 UT/500ML-% IV SOLN
INTRAVENOUS | Status: DC | PRN
  Administered 2023-06-18: 30 [IU] via INTRAVENOUS

## 2023-06-18 MED ORDER — HYDROMORPHONE HCL 1 MG/ML IJ SOLN
0.2500 mg | INTRAMUSCULAR | Status: DC | PRN
  Administered 2023-06-18 (×2): 0.25 mg via INTRAVENOUS

## 2023-06-18 MED ORDER — ONDANSETRON HCL 4 MG/2ML IJ SOLN
INTRAMUSCULAR | Status: AC
  Filled 2023-06-18: qty 2

## 2023-06-18 MED ORDER — ONDANSETRON HCL 4 MG/2ML IJ SOLN: 4.0000 mg | Freq: Once | INTRAMUSCULAR | Status: DC | PRN

## 2023-06-18 MED ORDER — DIPHENHYDRAMINE HCL 50 MG/ML IJ SOLN
INTRAMUSCULAR | Status: AC
  Filled 2023-06-18: qty 1

## 2023-06-18 MED ORDER — LACTATED RINGERS IV SOLN: INTRAVENOUS | Status: AC

## 2023-06-18 MED ORDER — HYDRALAZINE HCL 20 MG/ML IJ SOLN: 10.0000 mg | INTRAMUSCULAR | Status: DC | PRN

## 2023-06-18 MED ORDER — SIMETHICONE 80 MG PO CHEW
80.0000 mg | CHEWABLE_TABLET | ORAL | Status: DC | PRN
  Administered 2023-06-19 – 2023-06-21 (×3): 80 mg via ORAL
  Filled 2023-06-18 (×3): qty 1

## 2023-06-18 MED ORDER — KETOROLAC TROMETHAMINE 30 MG/ML IJ SOLN: 30.0000 mg | Freq: Four times a day (QID) | INTRAMUSCULAR | Status: DC | PRN

## 2023-06-18 MED ORDER — FENTANYL CITRATE (PF) 100 MCG/2ML IJ SOLN
INTRAMUSCULAR | Status: AC
  Filled 2023-06-18: qty 2

## 2023-06-18 MED ORDER — NALOXONE HCL 4 MG/10ML IJ SOLN: 1.0000 ug/kg/h | INTRAVENOUS | Status: DC | PRN

## 2023-06-18 MED ORDER — MAGNESIUM SULFATE BOLUS VIA INFUSION
4.0000 g | Freq: Once | INTRAVENOUS | Status: AC
  Administered 2023-06-18: 4 g via INTRAVENOUS
  Filled 2023-06-18: qty 1000

## 2023-06-18 MED ORDER — BUTALBITAL-APAP-CAFFEINE 50-325-40 MG PO TABS: 1.0000 | ORAL_TABLET | Freq: Four times a day (QID) | ORAL | Status: DC | PRN

## 2023-06-18 MED ORDER — ACETAMINOPHEN 500 MG PO TABS: 1000.0000 mg | ORAL_TABLET | Freq: Four times a day (QID) | ORAL | Status: DC

## 2023-06-18 MED ORDER — COCONUT OIL OIL: 1.0000 | TOPICAL_OIL | Status: DC | PRN

## 2023-06-18 MED ORDER — PHENYLEPHRINE HCL-NACL 20-0.9 MG/250ML-% IV SOLN
INTRAVENOUS | Status: DC | PRN
  Administered 2023-06-18: 60 ug/min via INTRAVENOUS

## 2023-06-18 MED ORDER — LABETALOL HCL 5 MG/ML IV SOLN: 20.0000 mg | INTRAVENOUS | Status: DC | PRN

## 2023-06-18 MED ORDER — DOCUSATE SODIUM 100 MG PO CAPS
100.0000 mg | ORAL_CAPSULE | Freq: Every day | ORAL | Status: DC
  Administered 2023-06-18: 100 mg via ORAL
  Filled 2023-06-18: qty 1

## 2023-06-18 MED ORDER — OXYCODONE HCL 5 MG PO TABS
5.0000 mg | ORAL_TABLET | ORAL | Status: DC | PRN
  Administered 2023-06-18 – 2023-06-21 (×10): 10 mg via ORAL
  Administered 2023-06-21: 5 mg via ORAL
  Administered 2023-06-21 (×3): 10 mg via ORAL
  Administered 2023-06-21: 5 mg via ORAL
  Administered 2023-06-22 (×2): 10 mg via ORAL
  Filled 2023-06-18: qty 2
  Filled 2023-06-18: qty 1
  Filled 2023-06-18 (×14): qty 2
  Filled 2023-06-18: qty 1
  Filled 2023-06-18: qty 2

## 2023-06-18 MED ORDER — IBUPROFEN 600 MG PO TABS
600.0000 mg | ORAL_TABLET | Freq: Four times a day (QID) | ORAL | Status: DC
  Administered 2023-06-19 – 2023-06-22 (×11): 600 mg via ORAL
  Filled 2023-06-18 (×12): qty 1

## 2023-06-18 MED ORDER — KETOROLAC TROMETHAMINE 30 MG/ML IJ SOLN
30.0000 mg | Freq: Once | INTRAMUSCULAR | Status: AC | PRN
  Administered 2023-06-18: 30 mg via INTRAVENOUS

## 2023-06-18 MED ORDER — CLINDAMYCIN PHOSPHATE 900 MG/50ML IV SOLN: 900.0000 mg | Freq: Once | INTRAVENOUS | Status: DC

## 2023-06-18 MED ORDER — SODIUM CHLORIDE 0.9% FLUSH: 3.0000 mL | INTRAVENOUS | Status: DC | PRN

## 2023-06-18 MED ORDER — HYDROMORPHONE HCL 1 MG/ML IJ SOLN
INTRAMUSCULAR | Status: AC
  Filled 2023-06-18: qty 0.5

## 2023-06-18 MED ORDER — GENTAMICIN SULFATE 40 MG/ML IJ SOLN: Freq: Once | INTRAVENOUS | Status: DC

## 2023-06-18 MED ORDER — OXYTOCIN-SODIUM CHLORIDE 30-0.9 UT/500ML-% IV SOLN
INTRAVENOUS | Status: AC
  Filled 2023-06-18: qty 500

## 2023-06-18 MED ORDER — PHENYLEPHRINE 80 MCG/ML (10ML) SYRINGE FOR IV PUSH (FOR BLOOD PRESSURE SUPPORT)
PREFILLED_SYRINGE | INTRAVENOUS | Status: AC
  Filled 2023-06-18: qty 20

## 2023-06-18 MED ORDER — GENTAMICIN SULFATE 40 MG/ML IJ SOLN
5.0000 mg/kg | Freq: Once | INTRAVENOUS | Status: AC
  Administered 2023-06-18: 385.2 mg via INTRAVENOUS
  Filled 2023-06-18: qty 9.75

## 2023-06-18 MED ORDER — TRANEXAMIC ACID-NACL 1000-0.7 MG/100ML-% IV SOLN
1000.0000 mg | Freq: Once | INTRAVENOUS | Status: DC
Start: 2023-06-18 — End: 2023-06-18

## 2023-06-18 MED ORDER — OXYTOCIN-SODIUM CHLORIDE 30-0.9 UT/500ML-% IV SOLN: 2.5000 [IU]/h | INTRAVENOUS | Status: AC

## 2023-06-18 MED ORDER — CALCIUM CARBONATE ANTACID 500 MG PO CHEW: 2.0000 | CHEWABLE_TABLET | ORAL | Status: DC | PRN

## 2023-06-18 MED ORDER — ACETAMINOPHEN 325 MG PO TABS
650.0000 mg | ORAL_TABLET | ORAL | Status: DC | PRN
  Administered 2023-06-18 (×2): 650 mg via ORAL
  Filled 2023-06-18 (×2): qty 2

## 2023-06-18 MED ORDER — DIPHENHYDRAMINE HCL 50 MG/ML IJ SOLN
12.5000 mg | INTRAMUSCULAR | Status: DC | PRN
  Administered 2023-06-18: 12.5 mg via INTRAVENOUS

## 2023-06-18 MED ORDER — NALOXONE HCL 0.4 MG/ML IJ SOLN: 0.4000 mg | INTRAMUSCULAR | Status: DC | PRN

## 2023-06-18 MED ORDER — EPHEDRINE 5 MG/ML INJ
INTRAVENOUS | Status: AC
  Filled 2023-06-18: qty 10

## 2023-06-18 MED ORDER — BUPIVACAINE IN DEXTROSE 0.75-8.25 % IT SOLN
INTRATHECAL | Status: DC | PRN
  Administered 2023-06-18: 1.6 mL via INTRATHECAL

## 2023-06-18 MED ORDER — TRANEXAMIC ACID-NACL 1000-0.7 MG/100ML-% IV SOLN
INTRAVENOUS | Status: AC
  Filled 2023-06-18: qty 100

## 2023-06-18 MED ORDER — TRANEXAMIC ACID-NACL 1000-0.7 MG/100ML-% IV SOLN
INTRAVENOUS | Status: DC | PRN
  Administered 2023-06-18: 1000 mg via INTRAVENOUS

## 2023-06-18 MED ORDER — EPHEDRINE SULFATE-NACL 50-0.9 MG/10ML-% IV SOSY
PREFILLED_SYRINGE | INTRAVENOUS | Status: DC | PRN
Start: 2023-06-18 — End: 2023-06-18
  Administered 2023-06-18: 15 mg via INTRAVENOUS

## 2023-06-18 MED ORDER — PHENYLEPHRINE 80 MCG/ML (10ML) SYRINGE FOR IV PUSH (FOR BLOOD PRESSURE SUPPORT)
PREFILLED_SYRINGE | INTRAVENOUS | Status: DC | PRN
  Administered 2023-06-18: 160 ug via INTRAVENOUS

## 2023-06-18 MED ORDER — CLINDAMYCIN PHOSPHATE 900 MG/50ML IV SOLN
INTRAVENOUS | Status: DC | PRN
Start: 2023-06-18 — End: 2023-06-18
  Administered 2023-06-18: 900 mg via INTRAVENOUS

## 2023-06-18 MED ORDER — PRENATAL MULTIVITAMIN CH: 1.0000 | ORAL_TABLET | Freq: Every day | ORAL | Status: DC

## 2023-06-18 MED ORDER — KETOROLAC TROMETHAMINE 30 MG/ML IJ SOLN
30.0000 mg | Freq: Four times a day (QID) | INTRAMUSCULAR | Status: AC
  Administered 2023-06-18 – 2023-06-19 (×3): 30 mg via INTRAVENOUS
  Filled 2023-06-18 (×3): qty 1

## 2023-06-18 MED ORDER — WITCH HAZEL-GLYCERIN EX PADS: 1.0000 | MEDICATED_PAD | CUTANEOUS | Status: DC | PRN

## 2023-06-18 MED ORDER — MORPHINE SULFATE (PF) 0.5 MG/ML IJ SOLN
INTRAMUSCULAR | Status: AC
  Filled 2023-06-18: qty 10

## 2023-06-18 MED ORDER — SOD CITRATE-CITRIC ACID 500-334 MG/5ML PO SOLN
ORAL | Status: AC
  Administered 2023-06-18: 30 mL
  Filled 2023-06-18: qty 30

## 2023-06-18 MED ORDER — LABETALOL HCL 5 MG/ML IV SOLN: 80.0000 mg | INTRAVENOUS | Status: DC | PRN

## 2023-06-18 MED ORDER — TRANEXAMIC ACID-NACL 1000-0.7 MG/100ML-% IV SOLN
1000.0000 mg | Freq: Once | INTRAVENOUS | Status: AC
  Administered 2023-06-18: 1000 mg via INTRAVENOUS
  Filled 2023-06-18: qty 100

## 2023-06-18 MED ORDER — OXYCODONE HCL 5 MG PO TABS: 5.0000 mg | ORAL_TABLET | Freq: Four times a day (QID) | ORAL | Status: DC | PRN

## 2023-06-18 MED ORDER — PHENYLEPHRINE HCL-NACL 20-0.9 MG/250ML-% IV SOLN
INTRAVENOUS | Status: AC
  Filled 2023-06-18: qty 250

## 2023-06-18 MED ORDER — DIBUCAINE (PERIANAL) 1 % EX OINT: 1.0000 | TOPICAL_OINTMENT | CUTANEOUS | Status: DC | PRN

## 2023-06-18 MED ORDER — MENTHOL 3 MG MT LOZG: 1.0000 | LOZENGE | OROMUCOSAL | Status: DC | PRN

## 2023-06-18 MED ORDER — MAGNESIUM SULFATE 40 GM/1000ML IV SOLN
2.0000 g/h | INTRAVENOUS | Status: DC
  Administered 2023-06-18 (×2): 2 g/h via INTRAVENOUS
  Filled 2023-06-18 (×2): qty 1000

## 2023-06-18 MED ORDER — DEXAMETHASONE SODIUM PHOSPHATE 10 MG/ML IJ SOLN
INTRAMUSCULAR | Status: AC
  Filled 2023-06-18: qty 1

## 2023-06-18 MED ORDER — KETOROLAC TROMETHAMINE 30 MG/ML IJ SOLN
INTRAMUSCULAR | Status: AC
  Filled 2023-06-18: qty 1

## 2023-06-18 MED ORDER — MEPERIDINE HCL 25 MG/ML IJ SOLN: 6.2500 mg | INTRAMUSCULAR | Status: DC | PRN

## 2023-06-18 MED ORDER — SIMETHICONE 80 MG PO CHEW
80.0000 mg | CHEWABLE_TABLET | Freq: Three times a day (TID) | ORAL | Status: DC
  Administered 2023-06-19 – 2023-06-22 (×10): 80 mg via ORAL
  Filled 2023-06-18 (×10): qty 1

## 2023-06-18 MED ORDER — CLINDAMYCIN PHOSPHATE 900 MG/50ML IV SOLN
INTRAVENOUS | Status: AC
  Filled 2023-06-18: qty 50

## 2023-06-18 MED ORDER — SENNOSIDES-DOCUSATE SODIUM 8.6-50 MG PO TABS
2.0000 | ORAL_TABLET | Freq: Every day | ORAL | Status: DC
  Administered 2023-06-19 – 2023-06-22 (×4): 2 via ORAL
  Filled 2023-06-18 (×4): qty 2

## 2023-06-18 MED ORDER — ACETAMINOPHEN 500 MG PO TABS
1000.0000 mg | ORAL_TABLET | Freq: Four times a day (QID) | ORAL | Status: DC
  Administered 2023-06-18 – 2023-06-22 (×15): 1000 mg via ORAL
  Filled 2023-06-18 (×15): qty 2

## 2023-06-18 MED ORDER — LABETALOL HCL 100 MG PO TABS
100.0000 mg | ORAL_TABLET | Freq: Two times a day (BID) | ORAL | Status: DC
  Administered 2023-06-18: 100 mg via ORAL
  Filled 2023-06-18: qty 1

## 2023-06-18 MED ORDER — TRANEXAMIC ACID 1000 MG/10ML IV SOLN
1000.0000 mg | Freq: Once | INTRAVENOUS | Status: DC
Start: 2023-06-18 — End: 2023-06-18

## 2023-06-18 MED ORDER — MAGNESIUM SULFATE 40 GM/1000ML IV SOLN
2.0000 g/h | INTRAVENOUS | Status: AC
  Administered 2023-06-18 (×2): 2 g/h via INTRAVENOUS

## 2023-06-18 MED ORDER — LACTATED RINGERS IV SOLN: INTRAVENOUS | Status: DC

## 2023-06-18 MED ORDER — FENTANYL CITRATE (PF) 100 MCG/2ML IJ SOLN
INTRAMUSCULAR | Status: DC | PRN
  Administered 2023-06-18: 15 ug via INTRATHECAL

## 2023-06-18 MED ORDER — DIPHENHYDRAMINE HCL 25 MG PO CAPS: 25.0000 mg | ORAL_CAPSULE | Freq: Four times a day (QID) | ORAL | Status: DC | PRN

## 2023-06-18 SURGICAL SUPPLY — 40 items
APL PRP STRL LF DISP 70% ISPRP (MISCELLANEOUS) ×2
APL SKNCLS STERI-STRIP NONHPOA (GAUZE/BANDAGES/DRESSINGS) ×1
BENZOIN TINCTURE PRP APPL 2/3 (GAUZE/BANDAGES/DRESSINGS) ×2 IMPLANT
CHLORAPREP W/TINT 26 (MISCELLANEOUS) ×4 IMPLANT
CLAMP UMBILICAL CORD (MISCELLANEOUS) ×2 IMPLANT
CLOTH BEACON ORANGE TIMEOUT ST (SAFETY) ×2 IMPLANT
DRAIN JACKSON PRT FLT 10 (DRAIN) IMPLANT
DRSG OPSITE POSTOP 4X10 (GAUZE/BANDAGES/DRESSINGS) ×2 IMPLANT
ELECT REM PT RETURN 9FT ADLT (ELECTROSURGICAL) ×1
ELECTRODE REM PT RTRN 9FT ADLT (ELECTROSURGICAL) ×2 IMPLANT
EVACUATOR SILICONE 100CC (DRAIN) IMPLANT
EXTRACTOR VACUUM M CUP 4 TUBE (SUCTIONS) IMPLANT
GAUZE SPONGE 4X4 12PLY STRL LF (GAUZE/BANDAGES/DRESSINGS) IMPLANT
GLOVE BIO SURGEON STRL SZ 6.5 (GLOVE) ×2 IMPLANT
GLOVE BIOGEL PI IND STRL 7.0 (GLOVE) ×4 IMPLANT
GOWN STRL REUS W/TWL LRG LVL3 (GOWN DISPOSABLE) ×4 IMPLANT
KIT ABG SYR 3ML LUER SLIP (SYRINGE) IMPLANT
MAT PREVALON FULL STRYKER (MISCELLANEOUS) IMPLANT
NDL HYPO 25X5/8 SAFETYGLIDE (NEEDLE) IMPLANT
NEEDLE HYPO 25X5/8 SAFETYGLIDE (NEEDLE) IMPLANT
NS IRRIG 1000ML POUR BTL (IV SOLUTION) ×2 IMPLANT
PACK C SECTION WH (CUSTOM PROCEDURE TRAY) ×2 IMPLANT
PAD OB MATERNITY 4.3X12.25 (PERSONAL CARE ITEMS) ×2 IMPLANT
RTRCTR C-SECT PINK 25CM LRG (MISCELLANEOUS) IMPLANT
STRIP CLOSURE SKIN 1/2X4 (GAUZE/BANDAGES/DRESSINGS) ×2 IMPLANT
SUT CHROMIC 0 CT 1 (SUTURE) ×2 IMPLANT
SUT MNCRL AB 3-0 PS2 27 (SUTURE) ×2 IMPLANT
SUT PLAIN 2 0 (SUTURE) ×2
SUT PLAIN 2 0 XLH (SUTURE) ×2 IMPLANT
SUT PLAIN ABS 2-0 CT1 27XMFL (SUTURE) ×4 IMPLANT
SUT SILK 2 0 SH (SUTURE) IMPLANT
SUT VIC AB 0 CTX 36 (SUTURE) ×4
SUT VIC AB 0 CTX36XBRD ANBCTRL (SUTURE) ×8 IMPLANT
SUT VIC AB 2-0 SH 27 (SUTURE)
SUT VIC AB 2-0 SH 27XBRD (SUTURE) IMPLANT
SUT VIC AB 4-0 KS 27 (SUTURE) IMPLANT
TOWEL OR 17X24 6PK STRL BLUE (TOWEL DISPOSABLE) ×2 IMPLANT
TRAY FOLEY W/BAG SLVR 14FR LF (SET/KITS/TRAYS/PACK) ×2 IMPLANT
WATER STERILE IRR 1000ML POUR (IV SOLUTION) ×2 IMPLANT
YANKAUER SUCT BULB TIP NO VENT (SUCTIONS) IMPLANT
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
## 2023-06-18 NOTE — Plan of Care (Signed)
Problem: Education: Goal: Knowledge of disease or condition will improve 06/18/2023 2338 by Neldon Mc, RN Outcome: Progressing 06/18/2023 2338 by Neldon Mc, RN Outcome: Progressing Goal: Knowledge of the prescribed therapeutic regimen will improve 06/18/2023 2338 by Neldon Mc, RN Outcome: Progressing 06/18/2023 2338 by Neldon Mc, RN Outcome: Progressing   Problem: Fluid Volume: Goal: Peripheral tissue perfusion will improve 06/18/2023 2338 by Neldon Mc, RN Outcome: Progressing 06/18/2023 2338 by Neldon Mc, RN Outcome: Progressing   Problem: Clinical Measurements: Goal: Complications related to disease process, condition or treatment will be avoided or minimized 06/18/2023 2338 by Neldon Mc, RN Outcome: Progressing 06/18/2023 2338 by Neldon Mc, RN Outcome: Progressing   Problem: Education: Goal: Knowledge of General Education information will improve Description: Including pain rating scale, medication(s)/side effects and non-pharmacologic comfort measures 06/18/2023 2338 by Neldon Mc, RN Outcome: Progressing 06/18/2023 2338 by Neldon Mc, RN Outcome: Progressing   Problem: Health Behavior/Discharge Planning: Goal: Ability to manage health-related needs will improve 06/18/2023 2338 by Neldon Mc, RN Outcome: Progressing 06/18/2023 2338 by Neldon Mc, RN Outcome: Progressing   Problem: Clinical Measurements: Goal: Ability to maintain clinical measurements within normal limits will improve 06/18/2023 2338 by Neldon Mc, RN Outcome: Progressing 06/18/2023 2338 by Neldon Mc, RN Outcome: Progressing Goal: Will remain free from infection 06/18/2023 2338 by Neldon Mc, RN Outcome: Progressing 06/18/2023 2338 by Neldon Mc, RN Outcome: Progressing Goal: Diagnostic test results will improve 06/18/2023 2338 by Neldon Mc, RN Outcome:  Progressing 06/18/2023 2338 by Neldon Mc, RN Outcome: Progressing Goal: Respiratory complications will improve 06/18/2023 2338 by Neldon Mc, RN Outcome: Progressing 06/18/2023 2338 by Neldon Mc, RN Outcome: Progressing Goal: Cardiovascular complication will be avoided 06/18/2023 2338 by Neldon Mc, RN Outcome: Progressing 06/18/2023 2338 by Neldon Mc, RN Outcome: Progressing   Problem: Activity: Goal: Risk for activity intolerance will decrease 06/18/2023 2338 by Neldon Mc, RN Outcome: Progressing 06/18/2023 2338 by Neldon Mc, RN Outcome: Progressing   Problem: Nutrition: Goal: Adequate nutrition will be maintained 06/18/2023 2338 by Neldon Mc, RN Outcome: Progressing 06/18/2023 2338 by Neldon Mc, RN Outcome: Progressing   Problem: Coping: Goal: Level of anxiety will decrease 06/18/2023 2338 by Neldon Mc, RN Outcome: Progressing 06/18/2023 2338 by Neldon Mc, RN Outcome: Progressing   Problem: Elimination: Goal: Will not experience complications related to bowel motility 06/18/2023 2338 by Neldon Mc, RN Outcome: Progressing 06/18/2023 2338 by Neldon Mc, RN Outcome: Progressing Goal: Will not experience complications related to urinary retention 06/18/2023 2338 by Neldon Mc, RN Outcome: Progressing 06/18/2023 2338 by Neldon Mc, RN Outcome: Progressing   Problem: Pain Managment: Goal: General experience of comfort will improve 06/18/2023 2338 by Neldon Mc, RN Outcome: Progressing 06/18/2023 2338 by Neldon Mc, RN Outcome: Progressing   Problem: Safety: Goal: Ability to remain free from injury will improve 06/18/2023 2338 by Neldon Mc, RN Outcome: Progressing 06/18/2023 2338 by Neldon Mc, RN Outcome: Progressing   Problem: Skin Integrity: Goal: Risk for impaired skin integrity will decrease 06/18/2023 2338 by  Neldon Mc, RN Outcome: Progressing 06/18/2023 2338 by Neldon Mc, RN Outcome: Progressing   Problem: Education: Goal: Knowledge of disease or condition will improve 06/18/2023 2338 by Neldon Mc, RN Outcome: Progressing 06/18/2023 2338 by Neldon Mc, RN Outcome: Progressing Goal: Knowledge of the prescribed therapeutic regimen will improve 06/18/2023  2338 by Neldon Mc, RN Outcome: Progressing 06/18/2023 2338 by Neldon Mc, RN Outcome: Progressing Goal: Individualized Educational Video(s) 06/18/2023 2338 by Neldon Mc, RN Outcome: Progressing 06/18/2023 2338 by Neldon Mc, RN Outcome: Progressing   Problem: Clinical Measurements: Goal: Complications related to the disease process, condition or treatment will be avoided or minimized 06/18/2023 2338 by Neldon Mc, RN Outcome: Progressing 06/18/2023 2338 by Neldon Mc, RN Outcome: Progressing   Problem: Education: Goal: Knowledge of the prescribed therapeutic regimen will improve 06/18/2023 2338 by Neldon Mc, RN Outcome: Progressing 06/18/2023 2338 by Neldon Mc, RN Outcome: Progressing Goal: Understanding of sexual limitations or changes related to disease process or condition will improve 06/18/2023 2338 by Neldon Mc, RN Outcome: Progressing 06/18/2023 2338 by Neldon Mc, RN Outcome: Progressing Goal: Individualized Educational Video(s) 06/18/2023 2338 by Neldon Mc, RN Outcome: Progressing 06/18/2023 2338 by Neldon Mc, RN Outcome: Progressing   Problem: Self-Concept: Goal: Communication of feelings regarding changes in body function or appearance will improve 06/18/2023 2338 by Neldon Mc, RN Outcome: Progressing 06/18/2023 2338 by Neldon Mc, RN Outcome: Progressing   Problem: Skin Integrity: Goal: Demonstration of wound healing without infection will improve 06/18/2023 2338 by  Neldon Mc, RN Outcome: Progressing 06/18/2023 2338 by Neldon Mc, RN Outcome: Progressing   Problem: Education: Goal: Knowledge of condition will improve 06/18/2023 2338 by Neldon Mc, RN Outcome: Progressing 06/18/2023 2338 by Neldon Mc, RN Outcome: Progressing Goal: Individualized Educational Video(s) 06/18/2023 2338 by Neldon Mc, RN Outcome: Progressing 06/18/2023 2338 by Neldon Mc, RN Outcome: Progressing Goal: Individualized Newborn Educational Video(s) 06/18/2023 2338 by Neldon Mc, RN Outcome: Progressing 06/18/2023 2338 by Neldon Mc, RN Outcome: Progressing   Problem: Activity: Goal: Will verbalize the importance of balancing activity with adequate rest periods 06/18/2023 2338 by Neldon Mc, RN Outcome: Progressing 06/18/2023 2338 by Neldon Mc, RN Outcome: Progressing Goal: Ability to tolerate increased activity will improve 06/18/2023 2338 by Neldon Mc, RN Outcome: Progressing 06/18/2023 2338 by Neldon Mc, RN Outcome: Progressing   Problem: Coping: Goal: Ability to identify and utilize available resources and services will improve 06/18/2023 2338 by Neldon Mc, RN Outcome: Progressing 06/18/2023 2338 by Neldon Mc, RN Outcome: Progressing   Problem: Life Cycle: Goal: Chance of risk for complications during the postpartum period will decrease 06/18/2023 2338 by Neldon Mc, RN Outcome: Progressing 06/18/2023 2338 by Neldon Mc, RN Outcome: Progressing   Problem: Role Relationship: Goal: Ability to demonstrate positive interaction with newborn will improve 06/18/2023 2338 by Neldon Mc, RN Outcome: Progressing 06/18/2023 2338 by Neldon Mc, RN Outcome: Progressing   Problem: Skin Integrity: Goal: Demonstration of wound healing without infection will improve 06/18/2023 2338 by Neldon Mc, RN Outcome:  Progressing 06/18/2023 2338 by Neldon Mc, RN Outcome: Progressing

## 2023-06-18 NOTE — Consult Note (Signed)
Maternal-Fetal Medicine (Chart Note)  G7 [id number].  Patient with chronic pretension is admitted with superimposed preeclampsia with severe features.  After admission the patient required IV antihypertensive treatment and is on magnesium sulfate for seizure prophylaxis.  Protein creatinine ratio is increased. Patient was on 3 antihypertensives.  Obstetric history is significant for 2 vaginal and 3 cesarean deliveries.  After reviewing her chart, I discussed with Dr. Su Hilt. Patient has superimposed preeclampsia with severe features and required IV antihypertensive treatment and seizure prophylaxis.  Waiting for delivery till 37 weeks is only likely to increase maternal complications with no increased or neonatal benefit.  I recommend delivery now.

## 2023-06-18 NOTE — Anesthesia Preprocedure Evaluation (Addendum)
Anesthesia Evaluation  Patient identified by MRN, date of birth, ID band Patient awake    Reviewed: Allergy & Precautions, H&P , NPO status , Patient's Chart, lab work & pertinent test results, reviewed documented beta blocker date and time   Airway Mallampati: II  TM Distance: >3 FB Neck ROM: full    Dental no notable dental hx. (+) Teeth Intact   Pulmonary former smoker   Pulmonary exam normal        Cardiovascular hypertension, Pt. on medications and Pt. on home beta blockers Normal cardiovascular exam     Neuro/Psych  Headaches PSYCHIATRIC DISORDERS Anxiety Depression       GI/Hepatic negative GI ROS, Neg liver ROS,,,  Endo/Other    Morbid obesity  Renal/GU negative Renal ROS  negative genitourinary   Musculoskeletal negative musculoskeletal ROS (+)  Scoliosis   Abdominal  (+) + obese  Peds  Hematology   Anesthesia Other Findings   Reproductive/Obstetrics (+) Pregnancy                             Anesthesia Physical Anesthesia Plan  ASA: III  Anesthesia Plan: Spinal   Post-op Pain Management:    Induction:   PONV Risk Score and Plan: 3 and Ondansetron, Dexamethasone and Scopolamine patch - Pre-op  Airway Management Planned: Natural Airway, Nasal Cannula and Simple Face Mask  Additional Equipment: None  Intra-op Plan:   Post-operative Plan:   Informed Consent: I have reviewed the patients History and Physical, chart, labs and discussed the procedure including the risks, benefits and alternatives for the proposed anesthesia with the patient or authorized representative who has indicated his/her understanding and acceptance.       Plan Discussed with: CRNA  Anesthesia Plan Comments:         Anesthesia Quick Evaluation

## 2023-06-18 NOTE — Op Note (Signed)
Cesarean Section Procedure Note  Indications: [id number] at 36 4/7wks being delivered for [facility] with SI pre-eclampsia with severe features.  Pre-operative Diagnosis: 1.36 4/7wks 2.H/o Cesarean Section CHTN with SI Preeclampisa with Severe Features   Post-operative Diagnosis: 1.36 4/7wks 2.H/o Cesarean Section 3.CHTN with SI Preeclampisa with Severe Features  Procedure: Repeat Low Transverse Cesarean Section  Surgeon: Osborn Coho, MD    Assistants: Rhea Pink, CNM  Anesthesia: Regional  Anesthesiologist: Dr. Leilani Able  Procedure Details  The patient was taken to the operating room secondary to Pre-eclampsia with Severe Features after the risks, benefits, complications, treatment options, and expected outcomes were discussed with the patient.  The patient concurred with the proposed plan, giving informed consent which was signed and witnessed. The patient was taken to Operating Room A, identified as [name] and the procedure verified as C-Section Delivery. A Time Out was held and the above information confirmed.  After induction of anesthesia by obtaining a spinal, the patient was prepped and draped in the usual sterile manner. A Pfannenstiel skin incision was made and carried down through the subcutaneous tissue to the underlying layer of fascia.  The fascia was incised bilaterally and extended transversely bilaterally with the Mayo scissors. Kocher clamps were placed on the inferior aspect of the fascial incision and the underlying rectus muscle was separated from the fascia. The same was done on the superior aspect of the fascial incision.  The peritoneum was identified, entered bluntly and extended manually.  An Alexis self-retaining retractor was placed.  The utero-vesical peritoneal reflection was incised transversely and the bladder flap was bluntly freed from the lower uterine segment. A low transverse uterine incision was made with the scalpel and extended bilaterally  with the bandage scissors.  The infant was delivered in vertex position without difficulty.  After the umbilical cord was clamped and cut, the infant was handed to the awaiting pediatricians.  Cord blood was obtained for evaluation.  The placenta was removed intact and appeared to be within normal limits. The uterus was cleared of all clots and debris. The uterine incision was closed with running interlocking sutures of 0 Vicryl and a second imbricating layer was performed as well.   Bilateral tubes and ovaries appeared to be within normal limits.  Good hemostasis was noted.  Copious irrigation was performed until clear.  The peritoneum was repaired with 2-0 chromic via a running suture.  The fascia was reapproximated with a running suture of 0 Vicryl. The skin was reapproximated with a subcuticular suture of 3-0 monocryl.  Steristrips were applied.  Instrument, sponge, and needle counts were correct prior to abdominal closure and at the conclusion of the case.  The patient was awaiting transfer to the recovery room in good condition.  Findings: Live female infant with Apgars 8 at one minute and 9 at five minutes.  Normal appearing bilateral ovaries and fallopian tubes were noted.  Estimated Blood Loss:  465 ml         Drains: foley to gravity 100 cc         Total IV Fluids: 800 ml         Specimens to Pathology: none         Complications:  None; patient tolerated the procedure well.         Disposition: PACU - hemodynamically stable.         Condition: stable  Attending Attestation: I performed the procedure.  I was present and scrubbed and the assistant was required  due to complexity of anatomy.

## 2023-06-18 NOTE — Anesthesia Procedure Notes (Signed)
Spinal  Patient location during procedure: OR Start time: 06/18/2023 4:01 PM End time: 06/18/2023 4:03 PM Reason for block: surgical anesthesia Staffing Performed: anesthesiologist  Anesthesiologist: Leilani Able, MD Performed by: Leilani Able, MD Authorized by: Leilani Able, MD   Preanesthetic Checklist Completed: patient identified, IV checked, site marked, risks and benefits discussed, surgical consent, monitors and equipment checked, pre-op evaluation and timeout performed Spinal Block Patient position: sitting Prep: DuraPrep and site prepped and draped Patient monitoring: continuous pulse ox and blood pressure Approach: midline Location: L3-4 Injection technique: single-shot Needle Needle type: Pencan  Needle gauge: 24 G Needle length: 10 cm Needle insertion depth: 6 cm Assessment Sensory level: T4 Events: CSF return

## 2023-06-18 NOTE — Transfer of Care (Signed)
Immediate Anesthesia Transfer of Care Note  Patient: [name]  Procedure(s) Performed: CESAREAN SECTION  Patient Location: PACU  Anesthesia Type:Spinal  Level of Consciousness: awake, alert , and oriented  Airway & Oxygen Therapy: Patient Spontanous Breathing  Post-op Assessment: Report given to RN and Post -op Vital signs reviewed and stable  Post vital signs: Reviewed and stable  Last Vitals:  Vitals Value Taken Time  BP 127/56 06/18/23 1750  Temp 35.9 C 06/18/23 1740  Pulse 67 06/18/23 1757  Resp 19 06/18/23 1757  SpO2 93 % 06/18/23 1757  Vitals shown include unfiled device data.  Last Pain:  Vitals:   06/18/23 1745  TempSrc:   PainSc: 0-No pain      Patients Stated Pain Goal: 3 (06/18/23 0865)  Complications: No notable events documented.

## 2023-06-18 NOTE — Plan of Care (Signed)
CHL Tonsillectomy/Adenoidectomy, Postoperative PEDS care plan entered in error.

## 2023-06-18 NOTE — Plan of Care (Signed)
  Problem: Education: Goal: Knowledge of disease or condition will improve Outcome: Progressing Goal: Knowledge of the prescribed therapeutic regimen will improve Outcome: Progressing   Problem: Fluid Volume: Goal: Peripheral tissue perfusion will improve Outcome: Progressing   Problem: Clinical Measurements: Goal: Complications related to disease process, condition or treatment will be avoided or minimized Outcome: Progressing   Problem: Education: Goal: Knowledge of General Education information will improve Description: Including pain rating scale, medication(s)/side effects and non-pharmacologic comfort measures Outcome: Progressing   Problem: Health Behavior/Discharge Planning: Goal: Ability to manage health-related needs will improve Outcome: Progressing   Problem: Clinical Measurements: Goal: Ability to maintain clinical measurements within normal limits will improve Outcome: Progressing Goal: Will remain free from infection Outcome: Progressing Goal: Diagnostic test results will improve Outcome: Progressing Goal: Respiratory complications will improve Outcome: Progressing Goal: Cardiovascular complication will be avoided Outcome: Progressing   Problem: Activity: Goal: Risk for activity intolerance will decrease Outcome: Progressing   Problem: Nutrition: Goal: Adequate nutrition will be maintained Outcome: Progressing   Problem: Coping: Goal: Level of anxiety will decrease Outcome: Progressing   Problem: Elimination: Goal: Will not experience complications related to bowel motility Outcome: Progressing Goal: Will not experience complications related to urinary retention Outcome: Progressing   Problem: Pain Managment: Goal: General experience of comfort will improve Outcome: Progressing   Problem: Safety: Goal: Ability to remain free from injury will improve Outcome: Progressing   Problem: Skin Integrity: Goal: Risk for impaired skin integrity will  decrease Outcome: Progressing   Problem: Education: Goal: Knowledge of disease or condition will improve Outcome: Progressing Goal: Knowledge of the prescribed therapeutic regimen will improve Outcome: Progressing Goal: Individualized Educational Video(s) Outcome: Progressing   Problem: Clinical Measurements: Goal: Complications related to the disease process, condition or treatment will be avoided or minimized Outcome: Progressing   Problem: Education: Goal: Knowledge of the prescribed therapeutic regimen will improve Outcome: Progressing Goal: Understanding of sexual limitations or changes related to disease process or condition will improve Outcome: Progressing Goal: Individualized Educational Video(s) Outcome: Progressing   Problem: Self-Concept: Goal: Communication of feelings regarding changes in body function or appearance will improve Outcome: Progressing   Problem: Skin Integrity: Goal: Demonstration of wound healing without infection will improve Outcome: Progressing   Problem: Education: Goal: Knowledge of condition will improve Outcome: Progressing Goal: Individualized Educational Video(s) Outcome: Progressing Goal: Individualized Newborn Educational Video(s) Outcome: Progressing   Problem: Activity: Goal: Will verbalize the importance of balancing activity with adequate rest periods Outcome: Progressing Goal: Ability to tolerate increased activity will improve Outcome: Progressing   Problem: Coping: Goal: Ability to identify and utilize available resources and services will improve Outcome: Progressing   Problem: Life Cycle: Goal: Chance of risk for complications during the postpartum period will decrease Outcome: Progressing   Problem: Role Relationship: Goal: Ability to demonstrate positive interaction with newborn will improve Outcome: Progressing   Problem: Skin Integrity: Goal: Demonstration of wound healing without infection will  improve Outcome: Progressing

## 2023-06-18 NOTE — Final Progress Note (Signed)
Discussed patient with Dr. Judeth Cornfield who agrees with plan to deliver.  Plan discussed with the patient for delivery and she is agreeable.  Risks benefits and alternatives reviewed with the patient including but not limited to bleeding infection and injury.  Questions answered and consent signed and witnessed.

## 2023-06-19 ENCOUNTER — Encounter (HOSPITAL_COMMUNITY): Payer: Self-pay | Admitting: Obstetrics and Gynecology

## 2023-06-19 LAB — CBC
HCT: 34.1 % — ABNORMAL LOW (ref 36.0–46.0)
Hemoglobin: 11.8 g/dL — ABNORMAL LOW (ref 12.0–15.0)
MCH: 27.9 pg (ref 26.0–34.0)
MCHC: 34.6 g/dL (ref 30.0–36.0)
MCV: 80.6 fL (ref 80.0–100.0)
Platelets: 181 10*3/uL (ref 150–400)
RBC: 4.23 MIL/uL (ref 3.87–5.11)
RDW: 15.9 % — ABNORMAL HIGH (ref 11.5–15.5)
WBC: 13.3 10*3/uL — ABNORMAL HIGH (ref 4.0–10.5)
nRBC: 0 % (ref 0.0–0.2)

## 2023-06-19 LAB — COMPREHENSIVE METABOLIC PANEL
ALT: 11 U/L (ref 0–44)
AST: 16 U/L (ref 15–41)
Albumin: 2.4 g/dL — ABNORMAL LOW (ref 3.5–5.0)
Alkaline Phosphatase: 80 U/L (ref 38–126)
Anion gap: 15 (ref 5–15)
BUN: 12 mg/dL (ref 6–20)
CO2: 19 mmol/L — ABNORMAL LOW (ref 22–32)
Calcium: 7.9 mg/dL — ABNORMAL LOW (ref 8.9–10.3)
Chloride: 92 mmol/L — ABNORMAL LOW (ref 98–111)
Creatinine, Ser: 0.64 mg/dL (ref 0.44–1.00)
GFR, Estimated: >60 mL/min (ref >60)
Glucose, Bld: 97 mg/dL (ref 70–99)
Potassium: 4.1 mmol/L (ref 3.5–5.1)
Sodium: 126 mmol/L — ABNORMAL LOW (ref 135–145)
Total Bilirubin: 0.7 mg/dL (ref 0.3–1.2)
Total Protein: 5.5 g/dL — ABNORMAL LOW (ref 6.5–8.1)

## 2023-06-19 LAB — RPR: RPR Ser Ql: NONREACTIVE

## 2023-06-19 MED ORDER — NIFEDIPINE ER OSMOTIC RELEASE 60 MG PO TB24: 60.0000 mg | ORAL_TABLET | Freq: Every day | ORAL | Status: DC

## 2023-06-19 MED ORDER — LABETALOL HCL 100 MG PO TABS
100.0000 mg | ORAL_TABLET | Freq: Two times a day (BID) | ORAL | Status: DC
  Administered 2023-06-19 – 2023-06-21 (×4): 100 mg via ORAL
  Filled 2023-06-19 (×4): qty 1

## 2023-06-19 MED ORDER — MISOPROSTOL 200 MCG PO TABS: 1000.0000 ug | ORAL_TABLET | Freq: Once | ORAL | Status: DC

## 2023-06-19 MED ORDER — NIFEDIPINE ER OSMOTIC RELEASE 60 MG PO TB24
60.0000 mg | ORAL_TABLET | Freq: Every day | ORAL | Status: DC
  Administered 2023-06-19 – 2023-06-21 (×3): 60 mg via ORAL
  Filled 2023-06-19 (×3): qty 1

## 2023-06-19 MED ORDER — TRANEXAMIC ACID-NACL 1000-0.7 MG/100ML-% IV SOLN
1000.0000 mg | INTRAVENOUS | Status: AC
  Administered 2023-06-19: 1000 mg via INTRAVENOUS
  Filled 2023-06-19: qty 100

## 2023-06-19 NOTE — Anesthesia Postprocedure Evaluation (Signed)
Anesthesia Post Note  Patient: [name]  Procedure(s) Performed: CESAREAN SECTION     Anesthesia Type: Spinal Anesthetic complications: no   No notable events documented.  Last Vitals:  Vitals:   06/19/23 0330 06/19/23 0627  BP: (!) 159/93 135/77  Pulse: (!) 58 61  Resp:  18  Temp:  36.6 C  SpO2: 97% 96%    Last Pain:  Vitals:   06/19/23 0759  TempSrc:   PainSc: 4    Pain Goal: Patients Stated Pain Goal: 3 (06/19/23 8841)                 Caren Macadam

## 2023-06-19 NOTE — Social Work (Signed)
MOB was referred for history of depression/anxiety. * Referral screened out by Clinical Social Worker because none of the following criteria appear to apply: ~ History of anxiety/depression during this pregnancy, or of post-partum depression following prior delivery. ~ Diagnosis of anxiety and/or depression within last 3 years  Please contact the Clinical Social Worker if needs arise, by [facility] request, or if MOB scores greater than 9/yes to question 10 on [place] Postpartum Depression Screen.   Jimmy Picket, LCSW Clinical Social Worker

## 2023-06-19 NOTE — Lactation Note (Signed)
This note was copied from a baby's chart. Lactation Consultation Note  Patient Name: [name] VHQIO'N Date: 06/19/2023 Age:38 hours Reason for consult: Initial assessment;Late-preterm 34-36.6wks (see MOB- MR: hx C/S x3, AMA, Pre -E, PPH).  LC entered room, Per MOB infant recently finished BF for 20 minutes, LC did not observe latch at this time. LC assisted with pace feeding infant 16 mls of 20 kcal formula using White Nfant slow flow bottle nipple while MOB used the DEBP. MOB expressed 3 mls of colostrum that she will offer infant at the next feeding after latching infant at the breast. LC discussed LPTI feeding guidelines and MOB given MY Care Card that was placed in infant's basinet. LC discussed importance of maternal rest, diet and hydration. MOB knows to call RN/LC for latch assistance if needed. LC sent referral for STORK DEBP today. MOB was  made aware of O/P services, breastfeeding support groups, community resources, and our phone # for post-discharge questions.    Current feeding plan: 1- MOB will continue to follow LPTI feeding plan: Will BF infant every 3 hours for 15 minutes or less and limit total feedings  breast and bottle feeding to 30 minutes or less. 2- After latching infant at the breast, MOB will  continue to supplement infant with any EBM first that was pumped and then offer formula, on day 1 will offer 14 mls or more if infant wants it per feeding. 3- MOB will continue to use DEBP every 3 hours for 15 minutes or less  Maternal Data Does the patient have breastfeeding experience prior to this delivery?: Yes How long did the patient breastfeed?: MOB,BF 1st child 6 weeks, exclusively pumped for 2 weeks with twins (2&3 rd child), 4th child BF for 6 months, did not BF her 5 th or 6 th child.  Feeding Mother's Current Feeding Choice: Breast Milk and Formula  LATCH Score  LC did not observe latch, infant has finished BF prior to [facility] entering MOB's room.                    Lactation Tools Discussed/Used Tools: Pump;Flanges Flange Size: 24 Breast pump type: Double-Electric Breast Pump Pump Education: Setup, frequency, and cleaning;Milk Storage Reason for Pumping: Infant is LPTI Pumping frequency: MOB will continue to pump every 3 hours for 15 minutes on inital setting. Pumped volume: 3 mL  Interventions Interventions: Breast feeding basics reviewed;Position options;Hand pump;DEBP;Education;Pace feeding;LC Services brochure;Guidelines for Milk Supply and Pumping Schedule Handout;CDC Guidelines for Breast Pump Cleaning;LPT handout/interventions  Discharge Pump:  (LC sent referral for STORK DEBP)  Consult Status Consult Status: Follow-up Date: 06/19/23 Follow-up type: In-patient    Frederico Hamman 06/19/2023, 12:29 AM

## 2023-06-19 NOTE — Progress Notes (Signed)
Subjective: POD# 1 Information for the patient's newborn:  Taliana, Kremin Adileigh [[id number]]  female  Baby's Name Molli Hazard Circumcision Yes  Reports feeling tired, mag level of 5 and no signs of mag toxicity Feeding: breast Reports tolerating PO and denies N/V, foley removed, ambulating and urinating w/o difficulty  Pain controlled with  PO and IV meds Denies HA/SOB/dizziness  Flatus not passing Vaginal bleeding - no clots    Objective:  VS:  Vitals:   06/18/23 2151 06/18/23 2303 06/19/23 0330 06/19/23 0627  BP: 117/62 (!) 136/90 (!) 159/93 135/77  Pulse: 64 76 (!) 58 61  Resp:  20  18  Temp:  98 F (36.7 C)  97.8 F (36.6 C)  TempSrc:  Oral  Oral  SpO2:  96% 97% 96%  Weight:      Height:        Intake/Output Summary (Last 24 hours) at 06/19/2023 0650 Last data filed at 06/19/2023 4098 Gross per 24 hour  Intake 6353.64 ml  Output 3873 ml  Net 2480.64 ml     Recent Labs    06/18/23 1502 06/19/23 0508  WBC 9.2 13.3*  HGB 13.2 11.8*  HCT 38.2 34.1*  PLT 194 181    Blood type: --/--/A POS (10/17 2327) Rubella: Immune (06/18 0000)    Physical Exam:  General: alert, cooperative, and no distress CV: Regular rate and rhythm Resp: unlabored Abdomen: soft, non-tender, tympany in upper quadrants with hypoactive BS x4 Incision: clean, dry, intact, and pressure dressing in place Perineum: intact, non-edematous, no hemorrhoids Uterine Fundus: firm, below umbilicus, non-tender, no signs of atony Lochia:  two episodes of trickling lochia Ext: +1 edema, negative for tenderness, pain, and cords   Assessment/Plan: 37 y.o.   POD# 1. [id number]                  Principal Problem:   Chronic hypertension with superimposed pre-eclampsia Active Problems:   Severe preeclampsia   Status post repeat low transverse cesarean section   Routine post-op PP care          Advised warm fluids and ambulation to improve GI motility Lactation support PRN D/C magnesium at 1744 Normal  uterine involution Anticipate D/C on POD 2 or 3  Roma Schanz, DNP, CNM 06/19/2023, 6:50 AM

## 2023-06-19 NOTE — Anesthesia Postprocedure Evaluation (Signed)
Anesthesia Post Note  Patient: [name]  Procedure(s) Performed: CESAREAN SECTION     Patient location during evaluation: PACU Anesthesia Type: Spinal Level of consciousness: awake Pain management: pain level controlled Vital Signs Assessment: post-procedure vital signs reviewed and stable Respiratory status: spontaneous breathing Cardiovascular status: stable Postop Assessment: no headache, no backache, spinal receding, patient able to bend at knees and no apparent nausea or vomiting Anesthetic complications: no  No notable events documented.  Last Vitals:  Vitals:   06/19/23 0330 06/19/23 0627  BP: (!) 159/93 135/77  Pulse: (!) 58 61  Resp:  18  Temp:  36.6 C  SpO2: 97% 96%    Last Pain:  Vitals:   06/19/23 0759  TempSrc:   PainSc: 4    Pain Goal: Patients Stated Pain Goal: 3 (06/19/23 7829)                 Caren Macadam

## 2023-06-19 NOTE — Plan of Care (Signed)
  Problem: Education: Goal: Knowledge of disease or condition will improve Outcome: Progressing Goal: Knowledge of the prescribed therapeutic regimen will improve Outcome: Progressing   Problem: Fluid Volume: Goal: Peripheral tissue perfusion will improve Outcome: Progressing   Problem: Clinical Measurements: Goal: Complications related to disease process, condition or treatment will be avoided or minimized Outcome: Progressing   Problem: Education: Goal: Knowledge of General Education information will improve Description: Including pain rating scale, medication(s)/side effects and non-pharmacologic comfort measures Outcome: Progressing   Problem: Health Behavior/Discharge Planning: Goal: Ability to manage health-related needs will improve Outcome: Progressing   Problem: Clinical Measurements: Goal: Ability to maintain clinical measurements within normal limits will improve Outcome: Progressing Goal: Will remain free from infection Outcome: Progressing Goal: Diagnostic test results will improve Outcome: Progressing Goal: Respiratory complications will improve Outcome: Progressing Goal: Cardiovascular complication will be avoided Outcome: Progressing   Problem: Activity: Goal: Risk for activity intolerance will decrease Outcome: Progressing   Problem: Nutrition: Goal: Adequate nutrition will be maintained Outcome: Progressing   Problem: Coping: Goal: Level of anxiety will decrease Outcome: Progressing   Problem: Elimination: Goal: Will not experience complications related to bowel motility Outcome: Progressing Goal: Will not experience complications related to urinary retention Outcome: Progressing   Problem: Pain Managment: Goal: General experience of comfort will improve Outcome: Progressing   Problem: Safety: Goal: Ability to remain free from injury will improve Outcome: Progressing   Problem: Skin Integrity: Goal: Risk for impaired skin integrity will  decrease Outcome: Progressing   Problem: Education: Goal: Knowledge of disease or condition will improve Outcome: Progressing Goal: Knowledge of the prescribed therapeutic regimen will improve Outcome: Progressing Goal: Individualized Educational Video(s) Outcome: Progressing   Problem: Clinical Measurements: Goal: Complications related to the disease process, condition or treatment will be avoided or minimized Outcome: Progressing   Problem: Education: Goal: Knowledge of the prescribed therapeutic regimen will improve Outcome: Progressing Goal: Understanding of sexual limitations or changes related to disease process or condition will improve Outcome: Progressing Goal: Individualized Educational Video(s) Outcome: Progressing   Problem: Self-Concept: Goal: Communication of feelings regarding changes in body function or appearance will improve Outcome: Progressing   Problem: Skin Integrity: Goal: Demonstration of wound healing without infection will improve Outcome: Progressing   Problem: Education: Goal: Knowledge of condition will improve Outcome: Progressing Goal: Individualized Educational Video(s) Outcome: Progressing Goal: Individualized Newborn Educational Video(s) Outcome: Progressing   Problem: Activity: Goal: Will verbalize the importance of balancing activity with adequate rest periods Outcome: Progressing Goal: Ability to tolerate increased activity will improve Outcome: Progressing   Problem: Coping: Goal: Ability to identify and utilize available resources and services will improve Outcome: Progressing   Problem: Life Cycle: Goal: Chance of risk for complications during the postpartum period will decrease Outcome: Progressing   Problem: Role Relationship: Goal: Ability to demonstrate positive interaction with newborn will improve Outcome: Progressing   Problem: Skin Integrity: Goal: Demonstration of wound healing without infection will  improve Outcome: Progressing

## 2023-06-19 NOTE — Lactation Note (Signed)
This note was copied from a baby's chart. Lactation Consultation Note  Patient Name: [name] QMVHQ'I Date: 06/19/2023 Age:38 hours Reason for consult: Follow-up assessment;Late-preterm 34-36.6wks  P6- MOB states that infant is eating very well. MOB has been pumping and is starting to see some volume (3 mL). LC praised MOB for her hard work and volume. MOB has been following the LPI feeding guidelines and feels confident in infant's feeding plan.  LC reviewed infant's feeding plan, CDC milk storage guidelines, LC services handout, pace feeding and breast/engorgement care. LC encouraged MOB to call for further assistance as needed.  Maternal Data Does the patient have breastfeeding experience prior to this delivery?: Yes  Feeding Mother's Current Feeding Choice: Breast Milk and Formula Nipple Type: Nfant Standard Flow (white)  Lactation Tools Discussed/Used Tools: Pump;Flanges Flange Size: 21 Breast pump type: Double-Electric Breast Pump;Manual Pump Education: Setup, frequency, and cleaning;Milk Storage Pumping frequency: 15-20 min every 2-3 hrs Pumped volume: 3 mL  Interventions Interventions: Breast feeding basics reviewed;DEBP;Education;LC Services brochure;Pace feeding  Discharge Discharge Education: Engorgement and breast care;Warning signs for feeding baby Pump: DEBP;Personal (Medela)  Consult Status Consult Status: Follow-up Date: 06/20/23 Follow-up type: In-patient    Dema Severin BS, IBCLC 06/19/2023, 5:52 PM

## 2023-06-20 LAB — CBC
HCT: 33 % — ABNORMAL LOW (ref 36.0–46.0)
Hemoglobin: 11.2 g/dL — ABNORMAL LOW (ref 12.0–15.0)
MCH: 28.4 pg (ref 26.0–34.0)
MCHC: 33.9 g/dL (ref 30.0–36.0)
MCV: 83.8 fL (ref 80.0–100.0)
Platelets: 190 10*3/uL (ref 150–400)
RBC: 3.94 MIL/uL (ref 3.87–5.11)
RDW: 16.2 % — ABNORMAL HIGH (ref 11.5–15.5)
WBC: 9.4 10*3/uL (ref 4.0–10.5)
nRBC: 0 % (ref 0.0–0.2)

## 2023-06-20 NOTE — Progress Notes (Addendum)
POSTPARTUM PROGRESS NOTE  POD #2  Subjective:  [name] is a 37 y.o. [id number] s/p rLTCS at [redacted]w[redacted]d.  She reports she doing well. No acute events overnight. She reports she is doing well. She denies any problems with ambulating, voiding or po intake. Denies nausea or vomiting. She has  passed flatus. Pain is moderately controlled, feels worse with more activity. She has been unable to sleep overnight.  Lochia is appropriate.  Objective: Blood pressure (!) 153/83, pulse 76, temperature 98.1 F (36.7 C), temperature source Oral, resp. rate 18, height 5\' 6"  (1.676 m), weight 103.5 kg, last menstrual period 10/05/2022, SpO2 99%, unknown if currently breastfeeding.  Physical Exam:  General: alert, cooperative and no distress Chest: no respiratory distress Heart:regular rate, distal pulses intact Abdomen: soft, nontender,  Uterine Fundus: firm, appropriately tender DVT Evaluation: No calf swelling or tenderness Extremities: No LE edema Skin: warm, dry; incision is saturated honeycomb dressing in place  Recent Labs    06/18/23 1502 06/19/23 0508  HGB 13.2 11.8*  HCT 38.2 34.1*    Assessment/Plan: [name] is a 37 y.o. [id number] s/p rLTCS at [redacted]w[redacted]d for SIPE.  POD#2 - Doing well; pain well controlled. H/H appropriate. Repeat CBC due to saturated dressing. Need change of dressing and to reapply pressure dressing.   Routine postpartum care  OOB, ambulated  Lovenox for VTE prophylaxis Anemia: asymptomatic  cHTN BP elevated. -Labetalol 100mg  BID and procardia 60 mg XL started overnight -Consider the need to increase BP meds  Contraception: Declined Feeding: Br/Bo Infant: Boy-desires circ (consented) Dispo: Plan for discharge 10/21.   LOS: 2 days   Lavonda Jumbo, DO 06/20/2023, 6:34 AM

## 2023-06-20 NOTE — Lactation Note (Signed)
This note was copied from a baby's chart. Lactation Consultation Note  Patient Name: [name] [id number] Date: 06/20/2023 Age:38 hours   LC attempted to visit with P6 Mom of LPTI.  Mom and baby asleep.  LC noted pump parts partly disassembled by the sink.  LC disassembled the parts and washed, rinsed and placed on clean, dry paper towel on other side of splash guard, to dry.    Baby recently was fed 30 ml 22 cal formula.  Baby is at a 7.7% weight loss, which is a drop of  3.8% since yesterday.  Several voids yesterday and last stool over 24 hrs ago per EPIC.   LC will F/U at a later time.   Judee Clara 06/20/2023, 11:06 AM

## 2023-06-20 NOTE — Plan of Care (Signed)
  Problem: Education: Goal: Knowledge of disease or condition will improve Outcome: Progressing Goal: Knowledge of the prescribed therapeutic regimen will improve Outcome: Progressing   Problem: Fluid Volume: Goal: Peripheral tissue perfusion will improve Outcome: Progressing   Problem: Clinical Measurements: Goal: Complications related to disease process, condition or treatment will be avoided or minimized Outcome: Progressing   Problem: Education: Goal: Knowledge of General Education information will improve Description: Including pain rating scale, medication(s)/side effects and non-pharmacologic comfort measures Outcome: Progressing   Problem: Health Behavior/Discharge Planning: Goal: Ability to manage health-related needs will improve Outcome: Progressing   Problem: Clinical Measurements: Goal: Ability to maintain clinical measurements within normal limits will improve Outcome: Progressing Goal: Will remain free from infection Outcome: Progressing Goal: Diagnostic test results will improve Outcome: Progressing Goal: Respiratory complications will improve Outcome: Progressing Goal: Cardiovascular complication will be avoided Outcome: Progressing   Problem: Activity: Goal: Risk for activity intolerance will decrease Outcome: Progressing   Problem: Nutrition: Goal: Adequate nutrition will be maintained Outcome: Progressing   Problem: Coping: Goal: Level of anxiety will decrease Outcome: Progressing   Problem: Elimination: Goal: Will not experience complications related to bowel motility Outcome: Progressing Goal: Will not experience complications related to urinary retention Outcome: Progressing   Problem: Pain Managment: Goal: General experience of comfort will improve Outcome: Progressing   Problem: Safety: Goal: Ability to remain free from injury will improve Outcome: Progressing   Problem: Skin Integrity: Goal: Risk for impaired skin integrity will  decrease Outcome: Progressing   Problem: Education: Goal: Knowledge of disease or condition will improve Outcome: Progressing Goal: Knowledge of the prescribed therapeutic regimen will improve Outcome: Progressing Goal: Individualized Educational Video(s) Outcome: Progressing   Problem: Clinical Measurements: Goal: Complications related to the disease process, condition or treatment will be avoided or minimized Outcome: Progressing   Problem: Education: Goal: Knowledge of the prescribed therapeutic regimen will improve Outcome: Progressing Goal: Understanding of sexual limitations or changes related to disease process or condition will improve Outcome: Progressing Goal: Individualized Educational Video(s) Outcome: Progressing   Problem: Self-Concept: Goal: Communication of feelings regarding changes in body function or appearance will improve Outcome: Progressing   Problem: Skin Integrity: Goal: Demonstration of wound healing without infection will improve Outcome: Progressing   Problem: Education: Goal: Knowledge of condition will improve Outcome: Progressing Goal: Individualized Educational Video(s) Outcome: Progressing Goal: Individualized Newborn Educational Video(s) Outcome: Progressing   Problem: Activity: Goal: Will verbalize the importance of balancing activity with adequate rest periods Outcome: Progressing Goal: Ability to tolerate increased activity will improve Outcome: Progressing   Problem: Coping: Goal: Ability to identify and utilize available resources and services will improve Outcome: Progressing   Problem: Life Cycle: Goal: Chance of risk for complications during the postpartum period will decrease Outcome: Progressing   Problem: Role Relationship: Goal: Ability to demonstrate positive interaction with newborn will improve Outcome: Progressing   Problem: Skin Integrity: Goal: Demonstration of wound healing without infection will  improve Outcome: Progressing

## 2023-06-21 ENCOUNTER — Encounter (HOSPITAL_COMMUNITY)
Admission: RE | Admit: 2023-06-21 | Discharge: 2023-06-21 | Disposition: A | Discharge status: Home / Self Care | Payer: Medicaid Other | Source: Ambulatory Visit | Attending: Obstetrics and Gynecology | Admitting: Obstetrics and Gynecology

## 2023-06-21 MED ORDER — NIFEDIPINE ER OSMOTIC RELEASE 30 MG PO TB24
30.0000 mg | ORAL_TABLET | Freq: Once | ORAL | Status: AC
  Administered 2023-06-21: 30 mg via ORAL
  Filled 2023-06-21: qty 1

## 2023-06-21 MED ORDER — NIFEDIPINE ER OSMOTIC RELEASE 60 MG PO TB24
90.0000 mg | ORAL_TABLET | Freq: Every day | ORAL | Status: DC
  Administered 2023-06-22: 90 mg via ORAL
  Filled 2023-06-21: qty 1

## 2023-06-21 MED ORDER — LABETALOL HCL 200 MG PO TABS
200.0000 mg | ORAL_TABLET | Freq: Two times a day (BID) | ORAL | Status: DC
  Administered 2023-06-21 – 2023-06-22 (×2): 200 mg via ORAL
  Filled 2023-06-21 (×2): qty 1

## 2023-06-21 NOTE — Progress Notes (Addendum)
Subjective: Postpartum Day 3: Cesarean Delivery Patient reports tolerating PO.  With flatus.  She is ambulating and voiding without difficulty.  Her incisional pain is well controlled.    Objective: Vital signs in last 24 hours: Temp:  [97.5 F (36.4 C)-98.5 F (36.9 C)] 97.5 F (36.4 C) (10/21 1216) Pulse Rate:  [67-79] 77 (10/21 1216) Resp:  [16-20] 19 (10/21 1216) BP: (139-157)/(80-89) 157/81 (10/21 1216) SpO2:  [97 %-100 %] 98 % (10/21 1216)     06/21/2023   12:16 PM 06/21/2023    9:17 AM 06/21/2023    4:25 AM  Vitals with BMI  Systolic 157 150 578  Diastolic 81 81 80  Pulse 77 69 69    Physical Exam:  General: alert, cooperative, and no distress Lochia: appropriate Uterine Fundus: firm Abdomen:  with mild distension, soft.  Incision: with small serosangenous drainage on dressing, honey comb dressing changed, incision with steri strips, appears intact through steri strips.  DVT Evaluation: No evidence of DVT seen on physical exam. No significant calf/ankle edema.  Recent Labs    06/19/23 0508 06/20/23 0749  HGB 11.8* 11.2*  HCT 34.1* 33.0*   Assessment/Plan:  37 y/o [id number] POD # 3 Status post Cesarean section,  with hx of chronic HTN and Preeclampsia with severe features and is s/p 24 hr hours of magnesium sulfate, Postoperative course complicated by uncontrolled high blood pressure,   - Procardia 30 mg PO XL to be given now then will continue with Procardia XL 90 mg Q daily in the AM, also c/w labetalol 100 mg PO BID.  - COntinue current post-op care.    Prescilla Sours, MD 06/21/2023, 1:11 PM

## 2023-06-21 NOTE — Lactation Note (Signed)
This note was copied from a baby's chart. Lactation Consultation Note  Patient Name: [name] MVHQI'O Date: 06/21/2023 Age:38 hours Reason for consult: Follow-up assessment;Early term 37-38.6wks  LC in to visit with P7 Mom of ET infant.  Mom denies needing any assistance.  Baby is latching well per Mom, no discomfort and baby contented when he comes off.  Mom is also feeding baby formula by bottle, volumes of 40 ml.  Baby is currently at a 6.9% weight loss and gained 35 gm from yesterday. Engorgement prevention and treatment reviewed.  Encouraged continued STS and offering the breast often with feeding cues.   Mom to call prn for assistance.   Lactation Tools Discussed/Used Tools: Pump;Flanges Flange Size: 21 Breast pump type: Manual;Double-Electric Breast Pump Pump Education: Setup, frequency, and cleaning;Milk Storage Reason for Pumping: support full milk supply/infant being supplemented Pumping frequency: unsure, but encouraged pumping if baby is fed by bottle  Interventions Interventions: Skin to skin;Breast massage;Hand express;DEBP  Discharge Discharge Education: Engorgement and breast care  Consult Status Consult Status: Follow-up Date: 06/22/23 Follow-up type: In-patient    [name] 06/21/2023, 4:16 PM

## 2023-06-22 ENCOUNTER — Inpatient Hospital Stay (HOSPITAL_COMMUNITY)
Admission: RE | Admit: 2023-06-22 | Payer: Medicaid Other | Source: Home / Self Care | Admitting: Obstetrics and Gynecology

## 2023-06-22 DIAGNOSIS — Z8659 Personal history of other mental and behavioral disorders: Secondary | ICD-10-CM

## 2023-06-22 DIAGNOSIS — O10919 Unspecified pre-existing hypertension complicating pregnancy, unspecified trimester: Secondary | ICD-10-CM | POA: Diagnosis present

## 2023-06-22 DIAGNOSIS — O09529 Supervision of elderly multigravida, unspecified trimester: Secondary | ICD-10-CM

## 2023-06-22 DIAGNOSIS — Z98891 History of uterine scar from previous surgery: Secondary | ICD-10-CM

## 2023-06-22 DIAGNOSIS — O1210 Gestational proteinuria, unspecified trimester: Secondary | ICD-10-CM | POA: Diagnosis present

## 2023-06-22 DIAGNOSIS — O99210 Obesity complicating pregnancy, unspecified trimester: Secondary | ICD-10-CM | POA: Diagnosis present

## 2023-06-22 MED ORDER — OXYCODONE HCL 5 MG PO TABS: 5.0000 mg | ORAL_TABLET | ORAL | 0 refills | Status: DC | PRN

## 2023-06-22 MED ORDER — IBUPROFEN 600 MG PO TABS: 600.0000 mg | ORAL_TABLET | Freq: Four times a day (QID) | ORAL | 0 refills | Status: DC

## 2023-06-22 MED ORDER — LABETALOL HCL 200 MG PO TABS: 200.0000 mg | ORAL_TABLET | Freq: Two times a day (BID) | ORAL | 1 refills | Status: AC

## 2023-06-22 MED ORDER — SENNOSIDES-DOCUSATE SODIUM 8.6-50 MG PO TABS: 2.0000 | ORAL_TABLET | Freq: Every day | ORAL | 1 refills | Status: DC

## 2023-06-22 MED ORDER — ACETAMINOPHEN 500 MG PO TABS: 1000.0000 mg | ORAL_TABLET | Freq: Four times a day (QID) | ORAL | 0 refills | Status: DC

## 2023-06-22 MED ORDER — NIFEDIPINE ER OSMOTIC RELEASE 90 MG PO TB24: 90.0000 mg | ORAL_TABLET | Freq: Every day | ORAL | 1 refills | Status: AC

## 2023-06-22 NOTE — Lactation Note (Signed)
This note was copied from a baby's chart. Lactation Consultation Note  Patient Name: Boy Keisa Yandyke [id number] Date: 06/22/2023 Age:38 days Reason for consult: Follow-up assessment;Early term 37-38.6wks;Infant weight loss;Other (Comment) (AMA, - 3.77% WL)  Visited with family of 59 hours old ETI female; Ms. Titlow is a P7 and has some experience breastfeeding. She reports that breastfeeding and pumping are going well, her volumes continue to increase, praised her for her efforts. Noticed that pumping hasn't been consistent; she's been supplementing baby with Similac 22 calorie formula after feedings at the breast per LPI guidelines. Couplet is getting discharged today. Explained the importance of consistent pumping after feedings/attempts at the breast for the prevention of engorgement to to protect her supply. She politely declined an LC OP referral but has our contact information in case she needs to reach out. No other support person at this time. All questions and concerns answered, family to contact [facility] services PRN.  Feeding Mother's Current Feeding Choice: Breast Milk and Formula  Lactation Tools Discussed/Used Tools: Pump;Flanges Flange Size: 21 Breast pump type: Double-Electric Breast Pump Pump Education: Setup, frequency, and cleaning;Milk Storage Reason for Pumping: ETI Pumping frequency: 4 times/24 hours Pumped volume: 25 mL  Interventions Interventions: Breast feeding basics reviewed;DEBP;Education;LC Services brochure  Discharge Discharge Education: Outpatient recommendation  Consult Status Consult Status: Complete Date: 06/22/23 Follow-up type: Call as needed   Luci Bellucci Venetia Constable 06/22/2023, 3:57 PM

## 2023-06-22 NOTE — Discharge Summary (Signed)
Postpartum Discharge Summary  Date of Service updated11/22/24     Patient Name: [name] DOB: 20-Feb-1986 MRN: [id number]  Date of admission: 06/17/2023 Delivery date:06/18/2023 Delivering provider: Osborn Coho Date of discharge: 06/22/2023  Admitting diagnosis: Preeclampsia, third trimester [O14.93] Severe preeclampsia [O14.10] Chronic hypertension with superimposed pre-eclampsia [O11.9] Intrauterine pregnancy: [redacted]w[redacted]d     Secondary diagnosis:  Principal Problem:   Chronic hypertension with superimposed pre-eclampsia Active Problems:   Severe preeclampsia   Status post repeat low transverse cesarean section  Additional problems: CHTN    Discharge diagnosis: Term Pregnancy Delivered, Preeclampsia (severe), and CHTN                                              Post partum procedures:   Augmentation: N/A Complications: None  Hospital course: Sceduled C/S   37 y.o. yo [id number] at [redacted]w[redacted]d was admitted to the hospital 06/17/2023 for scheduled cesarean section with the following indication:Elective Repeat and PE with severe features .Delivery details are as follows:  Membrane Rupture Time/Date: 4:42 PM,06/18/2023  Delivery Method:C-Section, Low Transverse Operative Delivery:N/A Details of operation can be found in separate operative note.  Patient had a postpartum course complicated by elveated blood pressures.  She is ambulating, tolerating a regular diet, passing flatus, and urinating well. Patient is discharged home in stable condition on  06/22/23        Newborn Data: Birth date:06/18/2023 Birth time:4:43 PM Gender:Female Living status:Living Apgars:8 ,9  Weight:3320 g    Magnesium Sulfate received: Yes: Seizure prophylaxis BMZ received: No Rhophylac:No MMR:No T-DaP:Given prenatally Flu: No RSV Vaccine received: No Transfusion:No Immunizations administered: Immunization History  Administered Date(s) Administered   Tdap 10/08/2011    Physical exam   Vitals:   06/22/23 0424 06/22/23 0754 06/22/23 1224 06/22/23 1225  BP: 134/71 130/61 (!) 151/78   Pulse: 77 64 73   Resp: 19 18 18    Temp: 97.7 F (36.5 C) 98 F (36.7 C) 97.8 F (36.6 C)   TempSrc: Oral Oral Oral   SpO2: 98%   97%  Weight:      Height:       General: alert and cooperative Lochia: appropriate Uterine Fundus: firm Incision: Healing well with no significant drainage DVT Evaluation: No evidence of DVT seen on physical exam. Labs: Lab Results  Component Value Date   WBC 9.4 06/20/2023   HGB 11.2 (L) 06/20/2023   HCT 33.0 (L) 06/20/2023   MCV 83.8 06/20/2023   PLT 190 06/20/2023      Latest Ref Rng & Units 06/19/2023    5:08 AM  CMP  Glucose 70 - 99 mg/dL 97   BUN 6 - 20 mg/dL 12   Creatinine 4.33 - 1.00 mg/dL 2.95   Sodium 188 - 416 mmol/L 126   Potassium 3.5 - 5.1 mmol/L 4.1   Chloride 98 - 111 mmol/L 92   CO2 22 - 32 mmol/L 19   Calcium 8.9 - 10.3 mg/dL 7.9   Total Protein 6.5 - 8.1 g/dL 5.5   Total Bilirubin 0.3 - 1.2 mg/dL 0.7   Alkaline Phos 38 - 126 U/L 80   AST 15 - 41 U/L 16   ALT 0 - 44 U/L 11    Edinburgh Score:    06/19/2023   11:09 PM  Edinburgh Postnatal Depression Scale Screening Tool  I have been able to laugh  and see the funny side of things. 0  I have looked forward with enjoyment to things. 0  I have blamed myself unnecessarily when things went wrong. 0  I have been anxious or worried for no good reason. 0  I have felt scared or panicky for no good reason. 0  Things have been getting on top of me. 0  I have been so unhappy that I have had difficulty sleeping. 0  I have felt sad or miserable. 0  I have been so unhappy that I have been crying. 0  The thought of harming myself has occurred to me. 0  Edinburgh Postnatal Depression Scale Total 0      After visit meds:  Allergies as of 06/22/2023       Reactions   Penicillins Other (See Comments)   Mother endorsed reaction as a child.        Medication List      TAKE these medications    acetaminophen 500 MG tablet Commonly known as: TYLENOL Take 2 tablets (1,000 mg total) by mouth every 6 (six) hours. What changed:  when to take this reasons to take this   butalbital-acetaminophen-caffeine 50-325-40 MG tablet Commonly known as: FIORICET Take by mouth 2 (two) times daily as needed for headache.   cholecalciferol 25 MCG (1000 UNIT) tablet Commonly known as: VITAMIN D3 Take 1,000 Units by mouth daily.   ferrous sulfate 325 (65 FE) MG tablet Take 325 mg by mouth daily with breakfast.   ibuprofen 600 MG tablet Commonly known as: ADVIL Take 1 tablet (600 mg total) by mouth every 6 (six) hours.   labetalol 200 MG tablet Commonly known as: NORMODYNE Take 1 tablet (200 mg total) by mouth 2 (two) times daily. What changed:  medication strength how much to take   NIFEdipine 90 MG 24 hr tablet Commonly known as: Procardia XL Take 1 tablet (90 mg total) by mouth daily. Start taking on: June 23, 2023 What changed:  medication strength how much to take   oxyCODONE 5 MG immediate release tablet Commonly known as: Oxy IR/ROXICODONE Take 1-2 tablets (5-10 mg total) by mouth every 4 (four) hours as needed for moderate pain (pain score 4-6).   prenatal multivitamin Tabs tablet Take 1 tablet by mouth daily at 12 noon.   senna-docusate 8.6-50 MG tablet Commonly known as: Senokot-S Take 2 tablets by mouth daily. Start taking on: June 23, 2023         Discharge home in stable condition Infant Feeding: Breast Infant Disposition:home with mother Discharge instruction: per After Visit Summary and Postpartum booklet. Activity: Advance as tolerated. Pelvic rest for 6 weeks.  Diet: routine diet Anticipated Birth Control: Unsure Postpartum Appointment:1 week Additional Postpartum F/U: BP check 1 week Future Appointments:No future appointments. Follow up Visit:  Follow-up Information     [facility] Follow up in 1 week(s).   Specialty: Obstetrics and Gynecology Why: for blood pressure check Contact information: [address] [place] [address] [phone]                    06/22/2023 Michael Litter, MD

## 2023-07-10 ENCOUNTER — Telehealth (HOSPITAL_COMMUNITY): Payer: Self-pay

## 2023-07-10 NOTE — Telephone Encounter (Signed)
07/10/2023 1252  Name: [name] MRN: [id number] DOB: 25-Feb-1986  Reason for Call:  Transition of Care Hospital Discharge Call  Contact Status: Patient Contact Status: Message  Language assistant needed: Interpreter Mode: Interpreter Not Needed        Follow-Up Questions:    Inocente Salles Postnatal Depression Scale:  In the Past 7 Days:    PHQ2-9 Depression Scale:     Discharge Follow-up:    Post-discharge interventions: NA  Signature  Signe Colt

## 2023-07-13 ENCOUNTER — Encounter (HOSPITAL_COMMUNITY): Payer: Self-pay | Admitting: Psychiatry

## 2023-09-21 ENCOUNTER — Other Ambulatory Visit: Payer: Self-pay

## 2023-09-21 ENCOUNTER — Emergency Department (HOSPITAL_COMMUNITY)
Admission: EM | Admit: 2023-09-21 | Discharge: 2023-09-22 | Disposition: A | Discharge status: Home / Self Care | Payer: Medicaid Other | Attending: Emergency Medicine | Admitting: Emergency Medicine

## 2023-09-21 ENCOUNTER — Encounter (HOSPITAL_COMMUNITY): Payer: Self-pay

## 2023-09-21 DIAGNOSIS — S6991XA Unspecified injury of right wrist, hand and finger(s), initial encounter: Secondary | ICD-10-CM | POA: Diagnosis present

## 2023-09-21 DIAGNOSIS — Z23 Encounter for immunization: Secondary | ICD-10-CM | POA: Insufficient documentation

## 2023-09-21 DIAGNOSIS — W260XXA Contact with knife, initial encounter: Secondary | ICD-10-CM | POA: Diagnosis not present

## 2023-09-21 DIAGNOSIS — S61411A Laceration without foreign body of right hand, initial encounter: Secondary | ICD-10-CM | POA: Insufficient documentation

## 2023-09-21 MED ORDER — TETANUS-DIPHTH-ACELL PERTUSSIS 5-2.5-18.5 LF-MCG/0.5 IM SUSY
0.5000 mL | PREFILLED_SYRINGE | Freq: Once | INTRAMUSCULAR | Status: AC
  Administered 2023-09-21: 0.5 mL via INTRAMUSCULAR
  Filled 2023-09-21: qty 0.5

## 2023-09-21 MED ORDER — LIDOCAINE-EPINEPHRINE (PF) 2 %-1:200000 IJ SOLN
20.0000 mL | Freq: Once | INTRAMUSCULAR | Status: AC
  Administered 2023-09-21: 20 mL
  Filled 2023-09-21: qty 20

## 2023-09-21 NOTE — ED Provider Notes (Signed)
[place] EMERGENCY DEPARTMENT AT [facility] Provider Note   CSN: [id number] Arrival date & time: 09/21/23  2119     History  Chief Complaint  Patient presents with   Laceration    [name] is a 38 y.o. female.  The history is provided by the patient.  Patient presents after sustaining accidental laceration to her right hand.  This occurred around 9 PM.  She was cleaning knives when it slipped and cut her right hand.  Bleeding controlled.  No numbness or weakness in her hand.  No other complaints     Home Medications Prior to Admission medications   Medication Sig Start Date End Date Taking? Authorizing Provider  cholecalciferol (VITAMIN D3) 25 MCG (1000 UNIT) tablet Take 1,000 Units by mouth daily.    [provider]  ferrous sulfate 325 (65 FE) MG tablet Take 325 mg by mouth daily with breakfast.    [provider]  labetalol (NORMODYNE) 200 MG tablet Take 1 tablet (200 mg total) by mouth 2 (two) times daily. 06/22/23   Jaymes Graff, MD  NIFEdipine (PROCARDIA XL) 90 MG 24 hr tablet Take 1 tablet (90 mg total) by mouth daily. 06/23/23   Jaymes Graff, MD  Prenatal Vit-Fe Fumarate-FA (PRENATAL MULTIVITAMIN) TABS tablet Take 1 tablet by mouth daily at 12 noon.    [provider]      Allergies    Penicillins    Review of Systems   Review of Systems  Physical Exam Updated Vital Signs BP (!) 192/113 (BP Location: Left Arm)   Pulse 71   Temp 98.8 F (37.1 C) (Oral)   Resp 18   Ht 1.676 m (5\' 6" )   Wt 104.3 kg   LMP 08/15/2023 (Approximate)   SpO2 100%   BMI 37.12 kg/m  Physical Exam CONSTITUTIONAL: Well developed/well nourished HEAD: Normocephalic/atraumatic NEURO: Pt is awake/alert/appropriate, moves all extremitiesx4.  No facial droop.  Patient able to make a fist with the right hand without difficulty Full flexion extension of the right wrist, full flexion-extension of all fingers in the right hand EXTREMITIES:  pulses normal/equal, full ROM Laceration noted to the proximal surface of the right hand on the palmar surface.  Bleeding controlled.  No foreign bodies. It appears superficial. SKIN: warm, color normal PSYCH: no abnormalities of mood noted, alert and oriented to situation  ED Results / Procedures / Treatments   Labs (all labs ordered are listed, but only abnormal results are displayed) Labs Reviewed - No data to display  EKG None  Radiology No results found.  Procedures .Laceration Repair  Date/Time: 09/21/2023 11:39 PM  Performed by: Zadie Rhine, MD Authorized by: Zadie Rhine, MD   Consent:    Consent obtained:  Verbal   Consent given by:  Patient   Alternatives discussed:  No treatment Universal protocol:    Patient identity confirmed:  Provided demographic data Anesthesia:    Anesthesia method:  Local infiltration   Local anesthetic:  Lidocaine 2% WITH epi Laceration details:    Location:  Hand   Hand location:  R palm   Length (cm):  2 Treatment:    Amount of cleaning:  Standard Skin repair:    Repair method:  Sutures   Suture size:  4-0   Suture material:  Prolene   Suture technique:  Simple interrupted   Number of sutures:  3 Approximation:    Approximation:  Close Repair type:    Repair type:  Simple Post-procedure details:  Procedure completion:  Tolerated well, no immediate complications     Medications Ordered in ED Medications  Tdap (BOOSTRIX) injection 0.5 mL (0.5 mLs Intramuscular Given 09/21/23 2314)  lidocaine-EPINEPHrine (XYLOCAINE W/EPI) 2 %-1:200000 (PF) injection 20 mL (20 mLs Infiltration Given by Other 09/21/23 2336)    ED Course/ Medical Decision Making/ A&P                                 Medical Decision Making Risk Prescription drug management.           Final Clinical Impression(s) / ED Diagnoses Final diagnoses:  Laceration of right hand without foreign body, initial encounter    Rx / DC Orders ED  Discharge Orders     None         Zadie Rhine, MD 09/22/23 0001

## 2023-09-21 NOTE — ED Triage Notes (Signed)
Pt has laceration to right hand accidentally from a knife.

## 2023-09-22 NOTE — ED Notes (Signed)
Reviewed D/C information with the patient, pt verbalized understanding. No additional concerns at this time.

## 2023-10-08 ENCOUNTER — Other Ambulatory Visit: Payer: Self-pay

## 2023-10-08 ENCOUNTER — Encounter (HOSPITAL_COMMUNITY): Payer: Self-pay | Admitting: *Deleted

## 2023-10-08 ENCOUNTER — Emergency Department (HOSPITAL_COMMUNITY): Payer: Medicaid Other

## 2023-10-08 ENCOUNTER — Emergency Department (HOSPITAL_COMMUNITY)
Admission: EM | Admit: 2023-10-08 | Discharge: 2023-10-08 | Disposition: A | Discharge status: Home / Self Care | Payer: Medicaid Other | Attending: Emergency Medicine | Admitting: Emergency Medicine

## 2023-10-08 DIAGNOSIS — N939 Abnormal uterine and vaginal bleeding, unspecified: Secondary | ICD-10-CM | POA: Diagnosis present

## 2023-10-08 DIAGNOSIS — R109 Unspecified abdominal pain: Secondary | ICD-10-CM | POA: Diagnosis not present

## 2023-10-08 LAB — TYPE AND SCREEN
ABO/RH(D): A POS
Antibody Screen: NEGATIVE

## 2023-10-08 LAB — CBC WITH DIFFERENTIAL/PLATELET
Abs Immature Granulocytes: 0.01 10*3/uL (ref 0.00–0.07)
Basophils Absolute: 0.1 10*3/uL (ref 0.0–0.1)
Basophils Relative: 1 %
Eosinophils Absolute: 0.1 10*3/uL (ref 0.0–0.5)
Eosinophils Relative: 2 %
HCT: 35.4 % — ABNORMAL LOW (ref 36.0–46.0)
Hemoglobin: 11.2 g/dL — ABNORMAL LOW (ref 12.0–15.0)
Immature Granulocytes: 0 %
Lymphocytes Relative: 18 %
Lymphs Abs: 1.3 10*3/uL (ref 0.7–4.0)
MCH: 25.9 pg — ABNORMAL LOW (ref 26.0–34.0)
MCHC: 31.6 g/dL (ref 30.0–36.0)
MCV: 81.8 fL (ref 80.0–100.0)
Monocytes Absolute: 0.5 10*3/uL (ref 0.1–1.0)
Monocytes Relative: 7 %
Neutro Abs: 5.3 10*3/uL (ref 1.7–7.7)
Neutrophils Relative %: 72 %
Platelets: 305 10*3/uL (ref 150–400)
RBC: 4.33 MIL/uL (ref 3.87–5.11)
RDW: 14.6 % (ref 11.5–15.5)
WBC: 7.3 10*3/uL (ref 4.0–10.5)
nRBC: 0 % (ref 0.0–0.2)

## 2023-10-08 LAB — URINALYSIS, W/ REFLEX TO CULTURE (INFECTION SUSPECTED)
Bacteria, UA: NONE SEEN
Bilirubin Urine: NEGATIVE
Glucose, UA: NEGATIVE mg/dL
Ketones, ur: NEGATIVE mg/dL
Nitrite: POSITIVE — AB
Protein, ur: 100 mg/dL — AB
RBC / HPF: >50 RBC/hpf (ref 0–5)
Specific Gravity, Urine: 1.013 (ref 1.005–1.030)
pH: 7 (ref 5.0–8.0)

## 2023-10-08 LAB — PREGNANCY, URINE: Preg Test, Ur: NEGATIVE

## 2023-10-08 LAB — BASIC METABOLIC PANEL
Anion gap: 8 (ref 5–15)
BUN: 8 mg/dL (ref 6–20)
CO2: 23 mmol/L (ref 22–32)
Calcium: 9.1 mg/dL (ref 8.9–10.3)
Chloride: 107 mmol/L (ref 98–111)
Creatinine, Ser: 0.75 mg/dL (ref 0.44–1.00)
GFR, Estimated: >60 mL/min (ref >60)
Glucose, Bld: 87 mg/dL (ref 70–99)
Potassium: 3.9 mmol/L (ref 3.5–5.1)
Sodium: 138 mmol/L (ref 135–145)

## 2023-10-08 MED ORDER — IBUPROFEN 600 MG PO TABS: 600.0000 mg | ORAL_TABLET | Freq: Four times a day (QID) | ORAL | 0 refills | Status: AC | PRN

## 2023-10-08 MED ORDER — IOHEXOL 300 MG/ML  SOLN
100.0000 mL | Freq: Once | INTRAMUSCULAR | Status: AC | PRN
  Administered 2023-10-08: 100 mL via INTRAVENOUS

## 2023-10-08 MED ORDER — TRANEXAMIC ACID 650 MG PO TABS: 1300.0000 mg | ORAL_TABLET | Freq: Three times a day (TID) | ORAL | 0 refills | Status: AC

## 2023-10-08 MED ORDER — SODIUM CHLORIDE 0.9 % IV BOLUS
500.0000 mL | Freq: Once | INTRAVENOUS | Status: AC
  Administered 2023-10-08: 500 mL via INTRAVENOUS

## 2023-10-08 MED ORDER — DOXYCYCLINE HYCLATE 100 MG PO CAPS: 100.0000 mg | ORAL_CAPSULE | Freq: Two times a day (BID) | ORAL | 0 refills | Status: AC

## 2023-10-08 NOTE — Discharge Instructions (Addendum)
 Follow-up with gynecology.  Give a call on Monday.  Return for worsening bleeding or lightheadedness.  Some Butt paste or diaper paste may help on your abdomen.  The antibiotics can also help with infection.

## 2023-10-08 NOTE — ED Triage Notes (Addendum)
 Pt states she had some vaginal bleeding 2 weeks ago for 5 days. Vaginal bleeding returned for past 3 days, heavy with cramping. Pt gave recent birth 4 months- c-section, reports that she had a lot of hemorrhaging then. Pt also c/o hard time voiding.

## 2023-10-09 ENCOUNTER — Telehealth: Payer: Self-pay | Admitting: Family Medicine

## 2023-10-09 NOTE — ED Provider Notes (Signed)
 [place] EMERGENCY DEPARTMENT AT [facility] Provider Note   CSN: [id number] Arrival date & time: 10/08/23  1440     History  Chief Complaint  Patient presents with   Abdominal Pain   Vaginal Bleeding    [name] is a 38 y.o. female.   Abdominal Pain Associated symptoms: vaginal bleeding   Vaginal Bleeding Associated symptoms: abdominal pain   Patient is post C-section in October.  Now has had her second episode of vaginal bleeding since delivering had was likely her menses 2 weeks ago.  Last 5 days.  Then began to have some bleeding again now.  Has had 3 days of heavy bleeding.  States going through pads about every 2 hours.  Does have some crampy pain.  Also some difficulty emptying her bladder.  Thinks she may have an infection on her C-section site.     Home Medications Prior to Admission medications   Medication Sig Start Date End Date Taking? Authorizing Provider  doxycycline  (VIBRAMYCIN ) 100 MG capsule Take 1 capsule (100 mg total) by mouth 2 (two) times daily. 10/08/23  Yes Patsey Lot, MD  ibuprofen  (ADVIL ) 600 MG tablet Take 1 tablet (600 mg total) by mouth every 6 (six) hours as needed. 10/08/23  Yes Patsey Lot, MD  tranexamic acid  (LYSTEDA ) 650 MG TABS tablet Take 2 tablets (1,300 mg total) by mouth 3 (three) times daily for 5 days. 10/08/23 10/13/23 Yes Patsey Lot, MD  cholecalciferol (VITAMIN D3) 25 MCG (1000 UNIT) tablet Take 1,000 Units by mouth daily.    [provider]  ferrous sulfate  325 (65 FE) MG tablet Take 325 mg by mouth daily with breakfast.    [provider]  labetalol  (NORMODYNE ) 200 MG tablet Take 1 tablet (200 mg total) by mouth 2 (two) times daily. 06/22/23   Armond Cape, MD  NIFEdipine  (PROCARDIA  XL) 90 MG 24 hr tablet Take 1 tablet (90 mg total) by mouth daily. 06/23/23   Armond Cape, MD  Prenatal Vit-Fe Fumarate-FA (PRENATAL MULTIVITAMIN) TABS tablet Take 1 tablet by mouth daily at 12 noon.     [provider]      Allergies    Penicillins    Review of Systems   Review of Systems  Gastrointestinal:  Positive for abdominal pain.  Genitourinary:  Positive for vaginal bleeding.    Physical Exam Updated Vital Signs BP (!) 148/90 (BP Location: Right Arm)   Pulse 89   Temp 98.8 F (37.1 C) (Oral)   Resp 20   Ht 5' 6 (1.676 m)   Wt 102.5 kg   LMP 10/05/2023 (Approximate)   SpO2 98%   Breastfeeding No   BMI 36.48 kg/m  Physical Exam Vitals and nursing note reviewed.  Cardiovascular:     Rate and Rhythm: Normal rate.  Abdominal:     Tenderness: There is abdominal tenderness.     Comments: Some erythema at site of C-section on abdomen.  Potentially yeast also.  No fluctuance.  Genitourinary:    Comments: Pelvic exam showed vaginal bleeding.  No masses. Neurological:     Mental Status: She is alert.     ED Results / Procedures / Treatments   Labs (all labs ordered are listed, but only abnormal results are displayed) Labs Reviewed  CBC WITH DIFFERENTIAL/PLATELET - Abnormal; Notable for the following components:      Result Value   Hemoglobin 11.2 (*)    HCT 35.4 (*)    MCH 25.9 (*)    All  other components within normal limits  URINALYSIS, W/ REFLEX TO CULTURE (INFECTION SUSPECTED) - Abnormal; Notable for the following components:   APPearance HAZY (*)    Hgb urine dipstick LARGE (*)    Protein, ur 100 (*)    Nitrite POSITIVE (*)    Leukocytes,Ua TRACE (*)    All other components within normal limits  PREGNANCY, URINE  BASIC METABOLIC PANEL  TYPE AND SCREEN    EKG None  Radiology CT ABDOMEN PELVIS W CONTRAST Result Date: 10/08/2023 CLINICAL DATA:  Cesarean section 4 months ago, intermittent episodes of vaginal bleeding, heavy bleeding and cramping for 3 days EXAM: CT ABDOMEN AND PELVIS WITH CONTRAST TECHNIQUE: Multidetector CT imaging of the abdomen and pelvis was performed using the standard protocol following bolus administration of  intravenous contrast. RADIATION DOSE REDUCTION: This exam was performed according to the departmental dose-optimization program which includes automated exposure control, adjustment of the mA and/or kV according to patient size and/or use of iterative reconstruction technique. CONTRAST:  OMNIPAQUE  IOHEXOL  300 MG/ML  SOLN COMPARISON:  None Available. FINDINGS: Lower chest: No acute pleural or parenchymal lung disease. Hepatobiliary: Subcentimeter hypodensity posterior right lobe liver most consistent with cysts. Otherwise the liver and gallbladder are unremarkable. No biliary duct dilation. Pancreas: Unremarkable. No pancreatic ductal dilatation or surrounding inflammatory changes. Spleen: Normal in size without focal abnormality. Adrenals/Urinary Tract: Punctate less than 2 mm nonobstructing right renal calculus. No left-sided calculi. No obstructive uropathy within either kidney. The adrenals and bladder are unremarkable. Stomach/Bowel: No bowel obstruction or ileus. Normal appendix right lower quadrant. No bowel wall thickening or inflammatory change. Vascular/Lymphatic: No significant vascular findings are present. No enlarged abdominal or pelvic lymph nodes. Reproductive: The uterus is unremarkable, with no focal abnormality or fibroid. If vaginal bleeding persists and further evaluation is desired, pelvic ultrasound could be performed. There are no adnexal masses. Other: No free fluid or free intraperitoneal gas. No abdominal wall hernia. Healed incision from prior cesarean section. No evidence of complication. Musculoskeletal: No acute or destructive bony abnormalities. Reconstructed images demonstrate no additional findings. IMPRESSION: 1. Postsurgical changes from prior cesarean section. No evidence of complication or underlying uterine abnormality. If vaginal bleeding persists and remains unexplained, follow-up pelvic ultrasound may be useful. 2. Punctate 2 mm nonobstructing right renal calculus. 3.  Otherwise no acute intra-abdominal or intrapelvic process. Electronically Signed   By: Ozell Daring M.D.   On: 10/08/2023 21:37    Procedures Procedures    Medications Ordered in ED Medications  sodium chloride  0.9 % bolus 500 mL (0 mLs Intravenous Stopped 10/08/23 2127)  iohexol  (OMNIPAQUE ) 300 MG/ML solution 100 mL (100 mLs Intravenous Contrast Given 10/08/23 2122)    ED Course/ Medical Decision Making/ A&P                                 Medical Decision Making Amount and/or Complexity of Data Reviewed Labs: ordered. Radiology: ordered.  Risk Prescription drug management.   Patient vaginal bleeding.  History of C-section around 4 months ago.  Now more bleeding.  Hemoglobin is around where it was at discharge.  Likely down from being increased again since the delivery.  Potentially abdominal infection of cellulitis.  May be even urinary tract infection.  Although no bacteria seen did have nitrite and leukocyte Estrace positive.  Discussed with Dr. Rosalva from OB/GYN.  Recommend starting tranexamic acid  and ibuprofen .  Will give doxycycline  to cover cellulitis.  Short-term  follow-up with her obstetrician.  Return for worsening symptoms.        Final Clinical Impression(s) / ED Diagnoses Final diagnoses:  Vaginal bleeding    Rx / DC Orders ED Discharge Orders          Ordered    tranexamic acid  (LYSTEDA ) 650 MG TABS tablet  3 times daily        10/08/23 2251    doxycycline  (VIBRAMYCIN ) 100 MG capsule  2 times daily        10/08/23 2251    ibuprofen  (ADVIL ) 600 MG tablet  Every 6 hours PRN        10/08/23 2251              Patsey Lot, MD 10/09/23 [phone]

## 2023-10-09 NOTE — Telephone Encounter (Signed)
 error
